# Patient Record
Sex: Male | Born: 1960 | Race: White | Hispanic: No | Marital: Married | Smoking: Former smoker
Health system: Southern US, Community
[De-identification: ages and names within clinical notes are randomized; demographics above are authoritative.]

## PROBLEM LIST (undated history)

## (undated) DIAGNOSIS — I2510 Atherosclerotic heart disease of native coronary artery without angina pectoris: Secondary | ICD-10-CM

## (undated) DIAGNOSIS — Z87442 Personal history of urinary calculi: Secondary | ICD-10-CM

## (undated) DIAGNOSIS — M109 Gout, unspecified: Secondary | ICD-10-CM

## (undated) DIAGNOSIS — E785 Hyperlipidemia, unspecified: Secondary | ICD-10-CM

## (undated) DIAGNOSIS — I221 Subsequent ST elevation (STEMI) myocardial infarction of inferior wall: Secondary | ICD-10-CM

## (undated) DIAGNOSIS — K219 Gastro-esophageal reflux disease without esophagitis: Secondary | ICD-10-CM

## (undated) DIAGNOSIS — I779 Disorder of arteries and arterioles, unspecified: Secondary | ICD-10-CM

## (undated) HISTORY — PX: KNEE SURGERY: SHX244

## (undated) HISTORY — DX: Hyperlipidemia, unspecified: E78.5

## (undated) HISTORY — DX: Atherosclerotic heart disease of native coronary artery without angina pectoris: I25.10

## (undated) HISTORY — DX: Subsequent ST elevation (STEMI) myocardial infarction of inferior wall: I22.1

## (undated) HISTORY — PX: ANKLE SURGERY: SHX546

---

## 2002-10-25 ENCOUNTER — Encounter: Payer: Self-pay | Admitting: Emergency Medicine

## 2002-10-25 ENCOUNTER — Emergency Department (HOSPITAL_COMMUNITY): Admission: EM | Admit: 2002-10-25 | Discharge: 2002-10-25 | Payer: Self-pay | Admitting: Emergency Medicine

## 2012-08-28 ENCOUNTER — Encounter (HOSPITAL_COMMUNITY): Payer: Self-pay | Admitting: *Deleted

## 2012-08-28 ENCOUNTER — Encounter (HOSPITAL_COMMUNITY)
Admission: EM | Disposition: A | Discharge status: Home / Self Care | Payer: Self-pay | Source: Home / Self Care | Attending: Cardiology

## 2012-08-28 ENCOUNTER — Inpatient Hospital Stay (HOSPITAL_COMMUNITY)
Admission: EM | Admit: 2012-08-28 | Discharge: 2012-08-29 | DRG: 853 | Disposition: A | Discharge status: Home / Self Care | Payer: BC Managed Care – PPO | Attending: Cardiology | Admitting: Cardiology

## 2012-08-28 ENCOUNTER — Ambulatory Visit (HOSPITAL_COMMUNITY): Admit: 2012-08-28 | Payer: Self-pay | Admitting: Cardiology

## 2012-08-28 DIAGNOSIS — I219 Acute myocardial infarction, unspecified: Secondary | ICD-10-CM

## 2012-08-28 DIAGNOSIS — I2119 ST elevation (STEMI) myocardial infarction involving other coronary artery of inferior wall: Secondary | ICD-10-CM

## 2012-08-28 DIAGNOSIS — I2510 Atherosclerotic heart disease of native coronary artery without angina pectoris: Secondary | ICD-10-CM

## 2012-08-28 DIAGNOSIS — Z955 Presence of coronary angioplasty implant and graft: Secondary | ICD-10-CM

## 2012-08-28 DIAGNOSIS — I2129 ST elevation (STEMI) myocardial infarction involving other sites: Principal | ICD-10-CM | POA: Diagnosis present

## 2012-08-28 DIAGNOSIS — E782 Mixed hyperlipidemia: Secondary | ICD-10-CM

## 2012-08-28 DIAGNOSIS — M109 Gout, unspecified: Secondary | ICD-10-CM | POA: Diagnosis present

## 2012-08-28 DIAGNOSIS — I213 ST elevation (STEMI) myocardial infarction of unspecified site: Secondary | ICD-10-CM

## 2012-08-28 DIAGNOSIS — Z8249 Family history of ischemic heart disease and other diseases of the circulatory system: Secondary | ICD-10-CM

## 2012-08-28 DIAGNOSIS — K219 Gastro-esophageal reflux disease without esophagitis: Secondary | ICD-10-CM | POA: Insufficient documentation

## 2012-08-28 DIAGNOSIS — R030 Elevated blood-pressure reading, without diagnosis of hypertension: Secondary | ICD-10-CM | POA: Diagnosis present

## 2012-08-28 DIAGNOSIS — E785 Hyperlipidemia, unspecified: Secondary | ICD-10-CM | POA: Diagnosis present

## 2012-08-28 DIAGNOSIS — R7309 Other abnormal glucose: Secondary | ICD-10-CM | POA: Diagnosis present

## 2012-08-28 HISTORY — PX: LEFT HEART CATHETERIZATION WITH CORONARY ANGIOGRAM: SHX5451

## 2012-08-28 HISTORY — DX: Gastro-esophageal reflux disease without esophagitis: K21.9

## 2012-08-28 HISTORY — DX: Gout, unspecified: M10.9

## 2012-08-28 LAB — BASIC METABOLIC PANEL
BUN: 14 mg/dL (ref 6–23)
GFR calc Af Amer: >90 mL/min (ref >90)
GFR calc non Af Amer: >90 mL/min (ref >90)
Potassium: 4.1 mEq/L (ref 3.5–5.1)
Sodium: 137 mEq/L (ref 135–145)

## 2012-08-28 LAB — HEMOGLOBIN A1C
Hgb A1c MFr Bld: 5.7 % — ABNORMAL HIGH (ref <5.7)
Mean Plasma Glucose: 117 mg/dL — ABNORMAL HIGH (ref <117)

## 2012-08-28 LAB — CBC
HCT: 41.3 % (ref 39.0–52.0)
Hemoglobin: 15 g/dL (ref 13.0–17.0)
MCHC: 35.1 g/dL (ref 30.0–36.0)
Platelets: 235 10*3/uL (ref 150–400)
RBC: 4.77 MIL/uL (ref 4.22–5.81)
RDW: 12.4 % (ref 11.5–15.5)
WBC: 9 10*3/uL (ref 4.0–10.5)
WBC: 7.9 10*3/uL (ref 4.0–10.5)

## 2012-08-28 LAB — COMPREHENSIVE METABOLIC PANEL
ALT: 31 U/L (ref 0–53)
AST: 20 U/L (ref 0–37)
Alkaline Phosphatase: 83 U/L (ref 39–117)
CO2: 25 mEq/L (ref 19–32)
Chloride: 101 mEq/L (ref 96–112)
GFR calc Af Amer: >90 mL/min (ref >90)
GFR calc non Af Amer: >90 mL/min (ref >90)
Glucose, Bld: 141 mg/dL — ABNORMAL HIGH (ref 70–99)
Potassium: 3.9 mEq/L (ref 3.5–5.1)
Sodium: 136 mEq/L (ref 135–145)

## 2012-08-28 LAB — POCT I-STAT, CHEM 8
BUN: 17 mg/dL (ref 6–23)
Creatinine, Ser: 1 mg/dL (ref 0.50–1.35)
Glucose, Bld: 146 mg/dL — ABNORMAL HIGH (ref 70–99)
Hemoglobin: 16 g/dL (ref 13.0–17.0)
Potassium: 4.1 mEq/L (ref 3.5–5.1)
TCO2: 23 mmol/L (ref 0–100)

## 2012-08-28 LAB — POCT ACTIVATED CLOTTING TIME: Activated Clotting Time: 410 seconds

## 2012-08-28 LAB — APTT: aPTT: 26 seconds (ref 24–37)

## 2012-08-28 LAB — TROPONIN I
Troponin I: 1.27 ng/mL (ref <0.30)
Troponin I: 2.09 ng/mL (ref <0.30)
Troponin I: 2.59 ng/mL (ref <0.30)

## 2012-08-28 LAB — MRSA PCR SCREENING: MRSA by PCR: NEGATIVE

## 2012-08-28 LAB — POCT I-STAT TROPONIN I: Troponin i, poc: 0.01 ng/mL (ref 0.00–0.08)

## 2012-08-28 SURGERY — LEFT HEART CATHETERIZATION WITH CORONARY ANGIOGRAM
Anesthesia: LOCAL

## 2012-08-28 MED ORDER — LIDOCAINE HCL (PF) 1 % IJ SOLN
INTRAMUSCULAR | Status: AC
  Filled 2012-08-28: qty 30

## 2012-08-28 MED ORDER — VERAPAMIL HCL 2.5 MG/ML IV SOLN
INTRAVENOUS | Status: AC
  Filled 2012-08-28: qty 2

## 2012-08-28 MED ORDER — HEPARIN SODIUM (PORCINE) 5000 UNIT/ML IJ SOLN
5000.0000 [IU] | Freq: Three times a day (TID) | INTRAMUSCULAR | Status: DC
  Administered 2012-08-28 – 2012-08-29 (×3): 5000 [IU] via SUBCUTANEOUS
  Filled 2012-08-28 (×7): qty 1

## 2012-08-28 MED ORDER — ACETAMINOPHEN 325 MG PO TABS: 650.0000 mg | ORAL_TABLET | ORAL | Status: DC | PRN

## 2012-08-28 MED ORDER — NITROGLYCERIN 1 MG/10 ML FOR IR/CATH LAB
INTRA_ARTERIAL | Status: AC
  Filled 2012-08-28: qty 10

## 2012-08-28 MED ORDER — ASPIRIN 81 MG PO CHEW
81.0000 mg | CHEWABLE_TABLET | Freq: Every day | ORAL | Status: DC
  Administered 2012-08-28 – 2012-08-29 (×2): 81 mg via ORAL
  Filled 2012-08-28 (×2): qty 1

## 2012-08-28 MED ORDER — HEPARIN SODIUM (PORCINE) 5000 UNIT/ML IJ SOLN
4000.0000 [IU] | INTRAMUSCULAR | Status: AC
  Administered 2012-08-28: 4000 [IU] via INTRAVENOUS
  Filled 2012-08-28: qty 0.8

## 2012-08-28 MED ORDER — OXYCODONE-ACETAMINOPHEN 5-325 MG PO TABS: 1.0000 | ORAL_TABLET | ORAL | Status: DC | PRN

## 2012-08-28 MED ORDER — ASPIRIN 81 MG PO CHEW
324.0000 mg | CHEWABLE_TABLET | Freq: Once | ORAL | Status: AC
  Administered 2012-08-28: 324 mg via ORAL

## 2012-08-28 MED ORDER — DIAZEPAM 5 MG PO TABS
5.0000 mg | ORAL_TABLET | ORAL | Status: DC | PRN
  Administered 2012-08-28: 5 mg via ORAL
  Filled 2012-08-28: qty 1

## 2012-08-28 MED ORDER — PRASUGREL HCL 10 MG PO TABS
ORAL_TABLET | ORAL | Status: AC
  Filled 2012-08-28: qty 6

## 2012-08-28 MED ORDER — ASPIRIN 81 MG PO CHEW: 324.0000 mg | CHEWABLE_TABLET | Freq: Once | ORAL | Status: DC

## 2012-08-28 MED ORDER — ASPIRIN 81 MG PO CHEW
CHEWABLE_TABLET | ORAL | Status: AC
  Filled 2012-08-28: qty 4

## 2012-08-28 MED ORDER — BIVALIRUDIN 250 MG IV SOLR
INTRAVENOUS | Status: AC
  Filled 2012-08-28: qty 250

## 2012-08-28 MED ORDER — MIDAZOLAM HCL 2 MG/2ML IJ SOLN
INTRAMUSCULAR | Status: AC
  Filled 2012-08-28: qty 2

## 2012-08-28 MED ORDER — HEPARIN (PORCINE) IN NACL 2-0.9 UNIT/ML-% IJ SOLN
INTRAMUSCULAR | Status: AC
  Filled 2012-08-28: qty 1000

## 2012-08-28 MED ORDER — FENTANYL CITRATE 0.05 MG/ML IJ SOLN
INTRAMUSCULAR | Status: AC
  Filled 2012-08-28: qty 2

## 2012-08-28 MED ORDER — PRASUGREL HCL 10 MG PO TABS
10.0000 mg | ORAL_TABLET | Freq: Every day | ORAL | Status: DC
  Administered 2012-08-29: 10 mg via ORAL
  Filled 2012-08-28: qty 1

## 2012-08-28 MED ORDER — SODIUM CHLORIDE 0.9 % IV SOLN
0.2500 mg/kg/h | INTRAVENOUS | Status: AC
  Administered 2012-08-28: 0.25 mg/kg/h via INTRAVENOUS

## 2012-08-28 MED ORDER — ATORVASTATIN CALCIUM 80 MG PO TABS
80.0000 mg | ORAL_TABLET | Freq: Every day | ORAL | Status: DC
  Administered 2012-08-28: 80 mg via ORAL
  Filled 2012-08-28 (×2): qty 1

## 2012-08-28 MED ORDER — SODIUM CHLORIDE 0.9 % IV SOLN: INTRAVENOUS | Status: DC

## 2012-08-28 MED ORDER — METOPROLOL TARTRATE 25 MG PO TABS
25.0000 mg | ORAL_TABLET | Freq: Two times a day (BID) | ORAL | Status: DC
  Administered 2012-08-28 – 2012-08-29 (×3): 25 mg via ORAL
  Filled 2012-08-28 (×4): qty 1

## 2012-08-28 MED ORDER — SODIUM CHLORIDE 0.9 % IV SOLN
0.2500 mg/kg/h | INTRAVENOUS | Status: DC
  Filled 2012-08-28: qty 250

## 2012-08-28 MED ORDER — ONDANSETRON HCL 4 MG/2ML IJ SOLN: 4.0000 mg | Freq: Four times a day (QID) | INTRAMUSCULAR | Status: DC | PRN

## 2012-08-28 MED ORDER — HEPARIN SODIUM (PORCINE) 1000 UNIT/ML IJ SOLN
INTRAMUSCULAR | Status: AC
  Filled 2012-08-28: qty 1

## 2012-08-28 MED ORDER — SODIUM CHLORIDE 0.9 % IV SOLN
1.0000 mL/kg/h | INTRAVENOUS | Status: AC
  Administered 2012-08-28: 1 mL/kg/h via INTRAVENOUS

## 2012-08-28 NOTE — Progress Notes (Signed)
CRITICAL VALUE ALERT  Critical value received:  Troponin I 1.27  Date of notification:  08/28/12  Time of notification:  0645  Critical value read back:yes  Nurse who received alert:  Park Pope, RN  MD notified (1st page):  [facility]  Time of first page:  [phone]  MD notified (2nd page):  Time of second page:  Responding MD:  [facility]   Time MD responded:  [phone]

## 2012-08-28 NOTE — ED Provider Notes (Addendum)
History     CSN: [id number]  Arrival date & time 08/28/12  0203   First MD Initiated Contact with Patient 08/28/12 0210      Chief Complaint  Patient presents with  . Chest Pain    (Consider location/radiation/quality/duration/timing/severity/associated sxs/prior treatment) HPI 52 yo male presents to the ER from home with complaint of chest pain.  Pain started Sunday, dull, and has been intermittent since.  Denies SOB, diaphoresis, nausea.  Pain worsened about a hour ago, waking from sleep.  No prior h/o ACS, no family history.  Pt reports cold sxs over the last week, thought chest pain was associated with that.  Had made appointment with pcm tomorrow for eval of chest pain.  Much worse tonight.  History reviewed. No pertinent past medical history. aside from gout  History reviewed. No pertinent past surgical history.  History reviewed. No pertinent family history.  History  Substance Use Topics  . Smoking status: Not on file  . Smokeless tobacco: Not on file  . Alcohol Use: No      Review of Systems  See History of Present Illness; otherwise all other systems are reviewed and negative  Allergies  Review of patient's allergies indicates not on file.  Home Medications  No current outpatient prescriptions on file. Fish oil, niacin  BP 149/92  Pulse 69  Temp(Src) 98.1 F (36.7 C) (Oral)  Resp 18  Ht 5\' 10"  (1.778 m)  Wt 240 lb (108.863 kg)  BMI 34.44 kg/m2  SpO2 100%  Physical Exam  Nursing note and vitals reviewed. Constitutional: He is oriented to person, place, and time. He appears well-developed and well-nourished. No distress.  HENT:  Head: Normocephalic and atraumatic.  Nose: Nose normal.  Mouth/Throat: Oropharynx is clear and moist.  Eyes: Conjunctivae and EOM are normal. Pupils are equal, round, and reactive to light.  Neck: Normal range of motion. Neck supple. No JVD present. No tracheal deviation present. No thyromegaly present.  Cardiovascular:  Normal rate, regular rhythm, normal heart sounds and intact distal pulses.  Exam reveals no gallop and no friction rub.   No murmur heard. Pulmonary/Chest: Effort normal and breath sounds normal. No stridor. No respiratory distress. He has no wheezes. He has no rales. He exhibits no tenderness.  Abdominal: Soft. Bowel sounds are normal. He exhibits no distension and no mass. There is no tenderness. There is no rebound and no guarding.  Musculoskeletal: Normal range of motion. He exhibits no edema and no tenderness.  Lymphadenopathy:    He has no cervical adenopathy.  Neurological: He is alert and oriented to person, place, and time. He exhibits normal muscle tone. Coordination normal.  Skin: Skin is warm and dry. No rash noted. He is not diaphoretic. No erythema. No pallor.  Psychiatric: He has a normal mood and affect. His behavior is normal. Judgment and thought content normal.    ED Course  Procedures (including critical care time)  CRITICAL CARE Performed by: Olivia Mackie   Total critical care time: 30 min  Critical care time was exclusive of separately billable procedures and treating other patients.  Critical care was necessary to treat or prevent imminent or life-threatening deterioration.  Critical care was time spent personally by me on the following activities: development of treatment plan with patient and/or surrogate as well as nursing, discussions with consultants, evaluation of patient's response to treatment, examination of patient, obtaining history from patient or surrogate, ordering and performing treatments and interventions, ordering and review of laboratory studies, ordering and  review of radiographic studies, pulse oximetry and re-evaluation of patient's condition.   Labs Reviewed  POCT I-STAT, CHEM 8 - Abnormal; Notable for the following:    Glucose, Bld 146 (*)    All other components within normal limits  APTT  CBC  COMPREHENSIVE METABOLIC PANEL   PROTIME-INR   No results found.   Date: 08/28/2012  Rate: 70  Rhythm: normal sinus rhythm  QRS Axis: normal  Intervals: normal  ST/T Wave abnormalities: ST elevations inferiorly, ST depressions anteriorly and acute myocardial infarction  Conduction Disutrbances:none  Narrative Interpretation:   Old EKG Reviewed: none available    1. STEMI (ST elevation myocardial infarction)       MDM  52 year old male with ST elevation in 23 aVF, reciprocal changes in aVL, V2.  Concern for inferior posterior wall infarction.  Patient has received aspirin and heparin bolus.  He is being transferred to [facility] for evaluation by Dr. [place] on call for cardiology, with possible PCI.  Patient and family updated on findings and plan.       Olivia Mackie, MD 08/28/12 4782  Olivia Mackie, MD 08/28/12 [phone]

## 2012-08-28 NOTE — Progress Notes (Addendum)
Patient Name: Roy Douglas Date of Encounter: 08/28/2012   Principal Problem:   ST elevation myocardial infarction (STEMI) of inferior wall, initial episode of care Active Problems:   CAD (coronary artery disease)   SUBJECTIVE:  No complaints at this time.  Chest pain free.  His Rt hand and wrist feel fine.  TR band still in place.  No SOB.  CURRENT MEDS . aspirin  81 mg Oral Daily  . atorvastatin  80 mg Oral q1800  . heparin  5,000 Units Subcutaneous Q8H  . metoprolol tartrate  25 mg Oral BID   OBJECTIVE  Filed Vitals:   08/28/12 0515 08/28/12 0530 08/28/12 0600 08/28/12 0700  BP: 125/107 124/85 121/81 117/87  Pulse:      Temp:      TempSrc:      Resp: 17 15 17 15   Height:      Weight:      SpO2: 99% 97% 97% 95%    Intake/Output Summary (Last 24 hours) at 08/28/12 0745 Last data filed at 08/28/12 0700  Gross per 24 hour  Intake  260.3 ml  Output    700 ml  Net -439.7 ml   Filed Weights   08/28/12 0216 08/28/12 0223 08/28/12 0430  Weight: 240 lb (108.863 kg) 240 lb (108.863 kg) 235 lb 10.8 oz (106.9 kg)    PHYSICAL EXAM  General: Pleasant, NAD. Neuro: Alert and oriented X 3. Moves all extremities spontaneously. Psych: Normal affect. HEENT:  Normal  Neck: Supple without bruits or JVD. Lungs:  Resp regular and unlabored, CTA. Heart: RRR no s3, s4, or murmurs. Abdomen: Soft, non-tender, non-distended, BS + x 4.  Extremities: No hematoma or bleeding in Rt cath site.  TR band in place.  Capillary refill < 3 seconds.  Rt hand warm. No bruti. No clubbing, cyanosis or edema. DP/PT/Radials 2+ and equal bilaterally.   Accessory Clinical Findings  CBC  Recent Labs  08/28/12 0220 08/28/12 0223 08/28/12 0550  WBC 9.0  --  7.9  HGB 15.0 16.0 14.5  HCT 43.5 47.0 41.3  MCV 91.2  --  88.4  PLT 245  --  235   Basic Metabolic Panel  Recent Labs  08/28/12 0220 08/28/12 0223 08/28/12 0550  NA 136 140 137  K 3.9 4.1 4.1  CL 101 108 106  CO2 25  --  21    GLUCOSE 141* 146* 119*  BUN 16 17 14   CREATININE 0.95 1.00 0.81  CALCIUM 8.9  --  9.0   Liver Function Tests  Recent Labs  08/28/12 0220  AST 20  ALT 31  ALKPHOS 83  BILITOT 0.2*  PROT 7.5  ALBUMIN 3.7   Cardiac Enzymes  Recent Labs  08/28/12 0550  TROPONINI 1.27*   TELE:  NSR, ST elevation resolved.  Normal ECG  ECG  Rsr, 71, no acute st/t changes.   Radiology/Studies  No results found.  ASSESSMENT AND PLAN:    1.  Acute Inf STEMI/CAD:  Pt presented with c/p and inf st elevation and subsequently underwent cath and PCI with DES to the RCA.  Troponin is mildly elevated @ 1.27.  He is currently pain free and f/u ecg is nl.  Cont asa, effient (not currently ordered), bb, statin.  Cardiac rehab to see.  2.  Lipids:  Currently unknown.  F/u lipids.  lft's ok.  Cont high potency statin.  3.  Hyperglycemia:  F/u A1c.  Signed, Henningsgaard, Elige Radon, Student-PA  Nicolasa Ducking NP Patient seen and  examined and history reviewed. Agree with above findings and plan. Doing well post PCI. Ecg is normal. Troponin mildly elevated. No recurrent chest pain. Will ambulate today. Check lipid status. Anticipate DC tomorrow if stable.  Theron Arista [facility] 08/28/2012 11:20 AM

## 2012-08-28 NOTE — ED Notes (Signed)
Pt states last week had a head cold, then Sunday and Monday had 2 episodes of chest pain, tonight awoke from sleep d/t generalized chest pain, denies numbness/tingling, denies SOB, states feels a little lightheaded.

## 2012-08-28 NOTE — Care Management Note (Signed)
    Page 1 of 1   08/28/2012     10:29:32 AM   CARE MANAGEMENT NOTE 08/28/2012  Patient:  Roy Douglas, Roy Douglas   Account Number:  [id number]  Date Initiated:  08/28/2012  Documentation initiated by:  Junius Creamer  Subjective/Objective Assessment:   adm w mi     Action/Plan:   lives w wife, pcp dr Molly Maduro freid   Anticipated DC Date:     Anticipated DC Plan:        DC Planning Services  CM consult      Choice offered to / List presented to:             Status of service:   Medicare Important Message given?   (If response is "NO", the following Medicare IM given date fields will be blank) Date Medicare IM given:   Date Additional Medicare IM given:    Discharge Disposition:    Per UR Regulation:  Reviewed for med. necessity/level of care/duration of stay  If discussed at Long Length of Stay Meetings, dates discussed:    Comments:  4/16 1028 debbie Sakara Lehtinen rn,bsn pt has 64.00 copay for effient. will give pt 30days free and copay assist card.

## 2012-08-28 NOTE — ED Notes (Signed)
Patient states that he had chest pain on Sunday. States that the pain went away and he thought the pain was due "to a cold." States that he had 2 more episodes of chest pain since then. Tonight was woken from sleep with chest pain.

## 2012-08-28 NOTE — Progress Notes (Signed)
  Echocardiogram 2D Echocardiogram has been performed.  Georgian Co 08/28/2012, 3:51 PM

## 2012-08-28 NOTE — Progress Notes (Signed)
CARDIAC REHAB PHASE I   PRE:  Rate/Rhythm: 75SR  BP:  Supine: 102/79  Sitting:   Standing:    SaO2:   MODE:  Ambulation: 350 ft   POST:  Rate/Rhythm: 92SR PVCs  BP:  Supine:   Sitting: 141/81  Standing:    SaO2:  1342-1430 Pt walked 350 ft with steady gait. Tolerated well. No CP. To recliner after walk. Education completed with pt and wife. Understanding voiced. Discussed CRP 2. Permission given to refer to [place] Phase 2.    Luetta Nutting, RN BSN  08/28/2012 2:26 PM

## 2012-08-28 NOTE — CV Procedure (Signed)
   Cardiac Catheterization Procedure Note  Name: Roy Douglas MRN: [id number] DOB: 1961-02-20  Procedure: Left Heart Cath, Selective Coronary Angiography, LV angiography, PTCA and stenting of the proximal to mid RCA  Indication: 52 year old white male presents with intermittent chest pain of several days' duration. On presentation ECG demonstrates 2 mm of ST elevation in the inferior leads with reciprocal changes in the anterior leads. Patient was transferred from [facility] for emergent cardiac catheterization. In transfer his chest pain resolved and his ST segment elevation resolved.  Procedural Details:  The right wrist was prepped, draped, and anesthetized with 1% lidocaine. Using the modified Seldinger technique, a 6 French sheath was introduced into the right radial artery. 3 mg of verapamil was administered through the sheath, weight-based unfractionated heparin was administered intravenously. Standard Judkins catheters were used for selective coronary angiography and left ventriculography. Catheter exchanges were performed over an exchange length guidewire.  PROCEDURAL FINDINGS Hemodynamics: AO 122/58 with a mean of 80 mmHg LV 128/13 mmHg   Coronary angiography: Coronary dominance: right  Left mainstem: There is moderate calcification of the left main coronary. There is no significant stenosis.  Left anterior descending (LAD): There is moderate calcification of the proximal LAD. This vessel extends to the apex. It gives rise to 2 diagonal branches. In the mid LAD immediately after the second diagonal there is a 30-40% focal stenosis.  Left circumflex (LCx): The left circumflex gives rise to a large marginal branch that then bifurcates in the mid vessel. It is without significant disease.  Right coronary artery (RCA): The right coronary is a dominant vessel. There is a 95% stenosis in the proximal vessel. There is a long segment of disease extending through the mid vessel  up to 50-70%.  Left ventriculography: Left ventricular systolic function is abnormal. LV ejection fraction is estimated at 45-50%. There is inferior wall hypokinesis. There is no significant mitral insufficiency.  PCI Note:  Following the diagnostic procedure, the decision was made to proceed with PCI. Effient 60 mg was given orally. Weight-based bivalirudin was given for anticoagulation. Once a therapeutic ACT was achieved, a 6 [place] FR4 guide catheter was inserted.  A pro-water coronary guidewire was used to cross the lesion.  The lesion was predilated with a 2.5 mm balloon.  The lesion was then stented with a 3.0 x 38 mm Promus premier stent in the mid RCA.  We then placed a second 3.0 x 12 mm Promus premier stent in the proximal RCA overlapping with the initial stent. The stent was postdilated with a 3.25 mm noncompliant balloon.  Following PCI, there was 0% residual stenosis and TIMI-3 flow. Final angiography confirmed an excellent result. The patient tolerated the procedure well. There were no immediate procedural complications. A TR band was used for radial hemostasis. The patient was transferred to the post catheterization recovery area for further monitoring.  PCI Data: Vessel - RCA/Segment - proximal to mid Percent Stenosis (pre)  95% TIMI-flow 3 Stent 3.0 x 38 and 3.0 x 12 mm Promus premier stents Percent Stenosis (post) 0% TIMI-flow (post) 3  Final Conclusions:   1. Single vessel obstructive coronary artery disease. 2. Mild left ventricular dysfunction. 3. Successful stenting of the proximal to mid RCA with drug-eluting stents.   Recommendations:  Risk factor modification. Continue dual antiplatelet therapy for one year. Patient is a candidate for fast-track discharge.  [facility] 08/28/2012, 4:02 AM

## 2012-08-28 NOTE — H&P (Signed)
Physician History and Physical    Roy Douglas MRN: [id number] DOB/AGE: 51-24-1962 51 y.o. Admit date: 08/28/2012  Primary Care Physician: Dr. Foy Guadalajara Primary Cardiologist: New  HPI:  52 yo with minimal past history presented to [facility] with chest pain.  Patient first developed central chest pain on Sunday morning.  He thought that it was related to a cold that he has had for the last week.  Chest pain was mild and would come and go every couple of minutes.  He was able to work outside in the yard for most of the day without problems.  The pain returned for a period Monday morning and again this morning.  It lasted most of the day today on and off and was mild.  Then, the pain woke him up at 1:30 this morning.  It was much more severe than in the past.  He came to the ER where ECG showed inferior ST elevation concerning for STEMI.  He was given aspirin and transferred to [facility].   On the way here, his chest pain resolved.  He is now CP-free with ECG now showing inferior and anterolateral ST depression.    Patient does not smoke.  He says that his BP is sometimes elevated but he has not been given a formal diagnosis of HTN.  No diabetes.  His mother has had 2 stents but he is not sure how old she was when she got them.   Review of systems complete and found to be negative unless listed above   PMH: 1. Gout 2. Chest pain: Negative stress test in 6/13.  3. Elevated BP: Not on medications.   FH: Mother with CAD s/p PCI, not sure what age she was  History reviewed. No pertinent family history.  History   Social History  . Marital Status: Married    Spouse Name: N/A    Number of Children: N/A  . Years of Education: N/A   Occupational History  . Not on file.   Social History Main Topics  . Smoking status: Nonsmoker  . Smokeless tobacco: Not on file  . Alcohol Use: No  . Drug Use: No  . Sexually Active: Not on file   Other Topics Concern  . Not on file   Social History  Narrative  . Lives in [place]      (Not in a hospital admission)  Physical Exam: Blood pressure 148/92, pulse 69, temperature 98.4 F (36.9 C), temperature source Oral, resp. rate 16, height 5\' 10"  (1.778 m), weight 240 lb (108.863 kg), SpO2 100.00%.  General: NAD Neck: No JVD, no thyromegaly or thyroid nodule.  Lungs: Clear to auscultation bilaterally with normal respiratory effort. CV: Nondisplaced PMI.  Heart regular S1/S2, no S3/S4, no murmur.  No peripheral edema.  No carotid bruit.  Normal pedal pulses.  Abdomen: Soft, nontender, no hepatosplenomegaly, no distention.  Skin: Intact without lesions or rashes.  Neurologic: Alert and oriented x 3.  Psych: Normal affect. Extremities: No clubbing or cyanosis.  HEENT: Normal.   Labs:   Lab Results  Component Value Date   WBC 9.0 08/28/2012   HGB 16.0 08/28/2012   HCT 47.0 08/28/2012   MCV 91.2 08/28/2012   PLT 245 08/28/2012    Recent Labs Lab 08/28/12 0220 08/28/12 0223  NA 136 140  K 3.9 4.1  CL 101 108  CO2 25  --   BUN 16 17  CREATININE 0.95 1.00  CALCIUM 8.9  --   PROT  7.5  --   BILITOT 0.2*  --   ALKPHOS 83  --   ALT 31  --   AST 20  --   GLUCOSE 141* 146*    EKG: Initial ECG with NSR, inferior ST elevation.  Followup ECG with inferior and anterolateral ST depression.   ASSESSMENT AND PLAN:  52 yo with minimal PMH presents with inferior STEMI with spontaneous resolution of chest pain and ST elevation.  I am still concerned for unstable plaque.  He will go to the cath lab for emergent cardiac catheterization this morning.  Further recommendations to be based on cath findings.   Marca Ancona 08/28/2012, 3:03 AM

## 2012-08-28 NOTE — ED Provider Notes (Signed)
2:49 AM Resolution of all pain.  The patient is completely pain-free at this time.  His ST elevation is resolved.  This may represent spasm versus intermittent plaque occlusion.  The patient was met at the bedside by cardiology and myself.  The patient will be taken to catheter lab. ASA and heparin already given    Date: 08/28/2012  Rate: 75  Rhythm: normal sinus rhythm  QRS Axis: normal  Intervals: normal  ST/T Wave abnormalities: lateral ST depression and nonspecific inferior ST changes  Conduction Disutrbances: none  Narrative Interpretation:   Old EKG Reviewed: ST elevation in inferior leads no longer present.     Acute coronary syndrome       Lyanne Co, MD 08/28/12 [phone]

## 2012-08-28 NOTE — ED Notes (Signed)
Patient transported from [facility] for CP. Pt received 325 asa and 4000 units of Heparin at that facility. Pt currently pain free. Alertx4, NAD

## 2012-08-29 ENCOUNTER — Encounter (HOSPITAL_COMMUNITY): Payer: Self-pay | Admitting: *Deleted

## 2012-08-29 DIAGNOSIS — I2510 Atherosclerotic heart disease of native coronary artery without angina pectoris: Secondary | ICD-10-CM

## 2012-08-29 DIAGNOSIS — R7309 Other abnormal glucose: Secondary | ICD-10-CM | POA: Diagnosis present

## 2012-08-29 DIAGNOSIS — E782 Mixed hyperlipidemia: Secondary | ICD-10-CM | POA: Diagnosis present

## 2012-08-29 LAB — CBC
MCH: 31.2 pg (ref 26.0–34.0)
MCHC: 34.8 g/dL (ref 30.0–36.0)
MCV: 89.6 fL (ref 78.0–100.0)
Platelets: 219 10*3/uL (ref 150–400)
RDW: 12.5 % (ref 11.5–15.5)

## 2012-08-29 LAB — BASIC METABOLIC PANEL
CO2: 23 mEq/L (ref 19–32)
Calcium: 8.8 mg/dL (ref 8.4–10.5)
Creatinine, Ser: 0.92 mg/dL (ref 0.50–1.35)
GFR calc Af Amer: >90 mL/min (ref >90)
GFR calc non Af Amer: >90 mL/min (ref >90)

## 2012-08-29 LAB — LIPID PANEL: Total CHOL/HDL Ratio: 8.6 RATIO

## 2012-08-29 MED ORDER — PRASUGREL HCL 10 MG PO TABS: 10.0000 mg | ORAL_TABLET | Freq: Every day | ORAL | Status: DC

## 2012-08-29 MED ORDER — ATORVASTATIN CALCIUM 80 MG PO TABS: 80.0000 mg | ORAL_TABLET | Freq: Every day | ORAL | Status: DC

## 2012-08-29 MED ORDER — HEPARIN SODIUM (PORCINE) 1000 UNIT/ML IJ SOLN
INTRAMUSCULAR | Status: AC
  Filled 2012-08-29: qty 1

## 2012-08-29 MED ORDER — ASPIRIN 81 MG PO TABS: 81.0000 mg | ORAL_TABLET | Freq: Every day | ORAL | Status: DC

## 2012-08-29 MED ORDER — NITROGLYCERIN 0.4 MG SL SUBL: 0.4000 mg | SUBLINGUAL_TABLET | SUBLINGUAL | Status: DC | PRN

## 2012-08-29 MED ORDER — METOPROLOL TARTRATE 25 MG PO TABS: 25.0000 mg | ORAL_TABLET | Freq: Two times a day (BID) | ORAL | Status: DC

## 2012-08-29 MED FILL — Sodium Chloride IV Soln 0.9%: INTRAVENOUS | Qty: 50 | Status: AC

## 2012-08-29 NOTE — Progress Notes (Signed)
Patient Name: Roy Douglas Date of Encounter: 08/29/2012   Principal Problem:   ST elevation myocardial infarction (STEMI) of inferior wall, initial episode of care Active Problems:   CAD (coronary artery disease)   SUBJECTIVE:  No  Chest pain.    No SOB. Some discomfort left shoulder from sleeping on it.  CURRENT MEDS . aspirin  81 mg Oral Daily  . atorvastatin  80 mg Oral q1800  . heparin      . heparin  5,000 Units Subcutaneous Q8H  . metoprolol tartrate  25 mg Oral BID  . prasugrel  10 mg Oral Daily   OBJECTIVE  Filed Vitals:   08/28/12 1651 08/28/12 2040 08/29/12 0025 08/29/12 0400  BP: 143/81 110/63 118/71 120/65  Pulse: 77  77 67  Temp: 98.2 F (36.8 C) 98.8 F (37.1 C) 97.5 F (36.4 C) 97.8 F (36.6 C)  TempSrc: Oral Oral Oral Oral  Resp: 15 18 18 16   Height:      Weight:      SpO2: 97% 97% 98% 97%    Intake/Output Summary (Last 24 hours) at 08/29/12 0716 Last data filed at 08/28/12 1200  Gross per 24 hour  Intake  635.6 ml  Output    700 ml  Net  -64.4 ml   Filed Weights   08/28/12 0216 08/28/12 0223 08/28/12 0430  Weight: 240 lb (108.863 kg) 240 lb (108.863 kg) 235 lb 10.8 oz (106.9 kg)    PHYSICAL EXAM  General: Pleasant, NAD. Neuro: Alert and oriented X 3. Moves all extremities spontaneously. Psych: Normal affect. HEENT:  Normal  Neck: Supple without bruits or JVD. Lungs:  Resp regular and unlabored, CTA. Heart: RRR no s3, s4, or murmurs. Abdomen: Soft, non-tender, non-distended, BS + x 4.  Extremities: No hematoma or bleeding in Rt cath site.   No clubbing, cyanosis or edema. DP/PT/Radials 2+ and equal bilaterally.   Accessory Clinical Findings  CBC  Recent Labs  08/28/12 0550 08/29/12 0418  WBC 7.9 7.8  HGB 14.5 13.8  HCT 41.3 39.6  MCV 88.4 89.6  PLT 235 219   Basic Metabolic Panel  Recent Labs  08/28/12 0550 08/29/12 0418  NA 137 137  K 4.1 4.1  CL 106 105  CO2 21 23  GLUCOSE 119* 119*  BUN 14 11  CREATININE  0.81 0.92  CALCIUM 9.0 8.8   Liver Function Tests  Recent Labs  08/28/12 0220  AST 20  ALT 31  ALKPHOS 83  BILITOT 0.2*  PROT 7.5  ALBUMIN 3.7   Cardiac Enzymes  Recent Labs  08/28/12 0550 08/28/12 0910 08/28/12 1618  TROPONINI 1.27* 2.09* 2.59*   Lab Results  Component Value Date   CHOL 216* 08/29/2012   HDL 25* 08/29/2012   LDLCALC UNABLE TO CALCULATE IF TRIGLYCERIDE OVER 400 mg/dL 11/02/3084   TRIG 578* 4/69/6295   CHOLHDL 8.6 08/29/2012   Lab Results  Component Value Date   HGBA1C 5.7* 08/28/2012   TELE:  NSR, occ PVC  ECG  Rsr, 71, no acute st/t changes.   Radiology/Studies  No results found.  ASSESSMENT AND PLAN:    1.  Acute Inf STEMI/CAD:  Pt presented with c/p and inf st elevation and subsequently underwent cath and PCI with DES to the RCA.  Troponin is peak 2.59.  He is currently pain free and f/u ecg is nl. Echo is normal. Cont asa, effient, bb, statin.  Cardiac rehab. DC home today. May resume driving 2/84/13. Return to work 09/09/12.  2. Hyperlipidemia-combined:    lft's ok.  Cont high potency statin. Heart healthy diet with weight loss.  3.  Hyperglycemia: A1c normal. Should improve with appropriate diet and weight control.   [place]ajo Royal [facility] 08/29/2012 7:16 AM

## 2012-08-29 NOTE — Discharge Summary (Signed)
CARDIOLOGY DISCHARGE SUMMARY   Patient ID: Roy Douglas MRN: [id number] DOB/AGE: [birth year] 51 y.o.  Admit date: 08/28/2012 Discharge date: 08/29/2012  Primary Discharge Diagnosis:    ST elevation myocardial infarction (STEMI) of inferior wall, initial episode of care - s/p PCI with DES to the RCA   Secondary Discharge Diagnosis:    CAD (coronary artery disease)   Elevated glucose   Combined hyperlipidemia  Procedures:  Left Heart Cath, Selective Coronary Angiography, LV angiography, PTCA and stenting of the proximal to mid RCA,  2-D echocardiogram    Hospital Course: Roy Douglas is a 52 y.o. male with no history of CAD. He had recurrent chest pain prior to admission. On the day of admission he woke with chest pain that was more severe and his ECG showed inferior ST elevation. He went to the [facility] room where he was treated with aspirin and transferred to [facility]. His chest pain resolved but his ECG was still significantly abnormal so he was taken to the Cath Lab.  The full cardiac catheterization results are below but he had a drug-eluting stent the RCA with medical therapy for other nonobstructive disease. He was admitted to intensive care.  He was started on high-dose statin and a lipid profile is listed below. He was noted to be hyperglycemic but hemoglobin A1c was within normal limits. Dietary changes are encouraged and he should followup with primary care for this. He had a beta blocker added to his medication regimen and as well and tolerated this. He was seen by cardiac rehabilitation and educated on heart-healthy lifestyle modifications and exercise guidelines. He will follow up with outpatient cardiac rehabilitation.  A 08/29/2012, Roy Douglas was doing well and ambulating without chest pain or shortness of breath. He was evaluated by Dr. Swaziland and considered stable for discharge, to follow up as an outpatient.  Labs:   Lab Results  Component Value Date     WBC 7.8 08/29/2012   HGB 13.8 08/29/2012   HCT 39.6 08/29/2012   MCV 89.6 08/29/2012   PLT 219 08/29/2012    Recent Labs Lab 08/28/12 0220  08/29/12 0418  NA 136  < > 137  K 3.9  < > 4.1  CL 101  < > 105  CO2 25  < > 23  BUN 16  < > 11  CREATININE 0.95  < > 0.92  CALCIUM 8.9  < > 8.8  PROT 7.5  --   --   BILITOT 0.2*  --   --   ALKPHOS 83  --   --   ALT 31  --   --   AST 20  --   --   GLUCOSE 141*  < > 119*  < > = values in this interval not displayed.  Recent Labs  08/28/12 0550 08/28/12 0910 08/28/12 1618  TROPONINI 1.27* 2.09* 2.59*   Lipid Panel     Component Value Date/Time   CHOL 216* 08/29/2012 0418   TRIG 524* 08/29/2012 0418   HDL 25* 08/29/2012 0418   CHOLHDL 8.6 08/29/2012 0418   VLDL UNABLE TO CALCULATE IF TRIGLYCERIDE OVER 400 mg/dL 11/11/5282 1324   LDLCALC UNABLE TO CALCULATE IF TRIGLYCERIDE OVER 400 mg/dL 08/14/270 5366    Recent Labs  08/28/12 0220  INR 0.90   Lab Results  Component Value Date   HGBA1C 5.7* 08/28/2012    Cardiac Cath: 08/28/2012 Left mainstem: There is moderate calcification of the left main coronary. There is no significant stenosis.  Left anterior descending (LAD): There is moderate calcification of the proximal LAD. This vessel extends to the apex. It gives rise to 2 diagonal branches. In the mid LAD immediately after the second diagonal there is a 30-40% focal stenosis.  Left circumflex (LCx): The left circumflex gives rise to a large marginal branch that then bifurcates in the mid vessel. It is without significant disease.  Right coronary artery (RCA): The right coronary is a dominant vessel. There is a 95% stenosis in the proximal vessel. There is a long segment of disease extending through the mid vessel up to 50-70%.  Left ventriculography: Left ventricular systolic function is abnormal. LV ejection fraction is estimated at 45-50%. There is inferior wall hypokinesis. There is no significant mitral insufficiency. PCI Data:   Vessel - RCA/Segment - proximal to mid  Percent Stenosis (pre) 95%  TIMI-flow 3  Stent 3.0 x 38 and 3.0 x 12 mm Promus premier stents  Percent Stenosis (post) 0%  TIMI-flow (post) 3  Final Conclusions:  1. Single vessel obstructive coronary artery disease.  2. Mild left ventricular dysfunction.  3. Successful stenting of the proximal to mid RCA with drug-eluting stents.   EKG: 28-Aug-2012 07:23:10 [facility] System-MC-CCU ROUTINE RECORD Normal sinus rhythm Normal ECG 59mm/s 67mm/mV 100Hz  8.0.1 12SL 241 HD CID: 0 Referred by: Confirmed By: Marca Ancona MD Vent. rate 71 BPM PR interval 160 ms QRS duration 80 ms QT/QTc 398/432 ms P-R-T axes 10 15 44  Echo: 08/28/2012 Conclusions Left ventricle: The cavity size was normal. Wall thickness was normal. Systolic function was normal. The estimated ejection fraction was in the range of 50% to 55%. Wall motion was normal; there were no regional wall motion abnormalities.    FOLLOW UP PLANS AND APPOINTMENTS Allergies  Allergen Reactions  . Keflex (Cephalexin)     Mental changes     Medication List    TAKE these medications       aspirin 81 MG tablet  Take 1 tablet (81 mg total) by mouth daily.     atorvastatin 80 MG tablet  Commonly known as:  LIPITOR  Take 1 tablet (80 mg total) by mouth daily.     FISH OIL CONCENTRATE PO  Take 1 capsule by mouth daily.     LORazepam 0.5 MG tablet  Commonly known as:  ATIVAN  Take 0.5 mg by mouth daily as needed for anxiety.     meloxicam 15 MG tablet  Commonly known as:  MOBIC  Take 15 mg by mouth daily as needed for pain.     metoprolol tartrate 25 MG tablet  Commonly known as:  LOPRESSOR  Take 1 tablet (25 mg total) by mouth 2 (two) times daily.     multivitamin with minerals tablet  Take 1 tablet by mouth daily.     niacin 500 MG CR tablet  Commonly known as:  NIASPAN  Take 500 mg by mouth daily before lunch.     nitroGLYCERIN 0.4 MG SL tablet  Commonly known  as:  NITROSTAT  Place 1 tablet (0.4 mg total) under the tongue every 5 (five) minutes as needed for chest pain.     NYQUIL PO  Take 1 capsule by mouth at bedtime as needed (cold symptoms).     prasugrel 10 MG Tabs  Commonly known as:  EFFIENT  Take 1 tablet (10 mg total) by mouth daily.        Discharge Orders   Future Appointments Provider Department Dept Phone  09/12/2012 11:30 AM Peter M Swaziland, MD [facility] Main Office [place]) [phone]   Future Orders Complete By Expires     Amb Referral to Cardiac Rehabilitation  As directed     Diet - low sodium heart healthy  As directed     Increase activity slowly  As directed       Follow-up Information   Follow up with Peter Swaziland, MD On 09/12/2012. (at 11:30 am)    Contact information:   [address] [place] [address] [phone]       Follow up with FRIED, Doris Cheadle, MD. (As needed)    Contact information:   [address] [place] [address] [phone]       BRING ALL MEDICATIONS WITH YOU TO FOLLOW UP APPOINTMENTS  Time spent with patient to include physician time: 36 min Signed: Theodore Demark, PA-C 08/29/2012, 10:18 AM Co-Sign MD

## 2012-08-29 NOTE — Progress Notes (Signed)
Monitor and IV's removed.  D/C instructions given to patient and wife.  Lengthy conversation about lifestyle modifications.  Teach back used.   Pt d/c with all belongings to the car with wife.  Suzzette Righter [facility]

## 2012-08-29 NOTE — Progress Notes (Signed)
CARDIAC REHAB PHASE I   PRE:  Rate/Rhythm: 82SR  BP:  Supine:   Sitting: 113/67  Standing:    SaO2:   MODE:  Ambulation: 700 ft   POST:  Rate/Rhythm: 92SR  BP:  Supine:   Sitting: 103/65  Standing:    SaO2:  0840-0857  Pt walked 700 ft with steady gait. Tolerated well. No questions re ed yesterday.   Luetta Nutting, RN BSN  08/29/2012 8:54 AM

## 2012-08-29 NOTE — Discharge Summary (Signed)
Patient seen and examined and history reviewed. Agree with above findings and plan. See my earlier rounding note.   Theron Arista JordanMD 08/29/2012 1:24 PM

## 2012-09-12 ENCOUNTER — Ambulatory Visit (INDEPENDENT_AMBULATORY_CARE_PROVIDER_SITE_OTHER): Payer: BC Managed Care – PPO | Admitting: Cardiology

## 2012-09-12 ENCOUNTER — Encounter: Payer: Self-pay | Admitting: Cardiology

## 2012-09-12 VITALS — BP 118/78 | HR 75 | Ht 70.0 in | Wt 234.8 lb

## 2012-09-12 DIAGNOSIS — R7309 Other abnormal glucose: Secondary | ICD-10-CM

## 2012-09-12 DIAGNOSIS — I2119 ST elevation (STEMI) myocardial infarction involving other coronary artery of inferior wall: Secondary | ICD-10-CM

## 2012-09-12 DIAGNOSIS — E782 Mixed hyperlipidemia: Secondary | ICD-10-CM

## 2012-09-12 DIAGNOSIS — I2510 Atherosclerotic heart disease of native coronary artery without angina pectoris: Secondary | ICD-10-CM

## 2012-09-12 NOTE — Progress Notes (Signed)
Freda Munro Date of Birth: 1960/10/03 Medical Record #[id number]  History of Present Illness: Mr. Carfagno is seen for followup after recent hospitalization with an acute inferior ST elevation MI. He underwent emergent stenting of the right coronary with drug-eluting stents. The segment of disease in the right coronary was fairly long and it required 2 stents to adequately cover. He otherwise had nonobstructive disease. Left ventricular function was mildly reduced by cath at 45-50%. By subsequent echocardiogram it was 50-55%. He continues to do very well since discharge. He has had no recurrent chest pain or shortness of breath. He is watching his diet carefully. The only thing he noted was that he was quite irritable for the first week or so. He is planning to enroll in cardiac rehabilitation.  Current Outpatient Prescriptions on File Prior to Visit  Medication Sig Dispense Refill  . aspirin 81 MG tablet Take 1 tablet (81 mg total) by mouth daily.  30 tablet    . atorvastatin (LIPITOR) 80 MG tablet Take 1 tablet (80 mg total) by mouth daily.  30 tablet  11  . LORazepam (ATIVAN) 0.5 MG tablet Take 0.5 mg by mouth daily as needed for anxiety.      . meloxicam (MOBIC) 15 MG tablet Take 15 mg by mouth daily as needed for pain.      . metoprolol tartrate (LOPRESSOR) 25 MG tablet Take 1 tablet (25 mg total) by mouth 2 (two) times daily.  60 tablet  11  . Multiple Vitamins-Minerals (MULTIVITAMIN WITH MINERALS) tablet Take 1 tablet by mouth daily.      . niacin (NIASPAN) 500 MG CR tablet Take 500 mg by mouth daily before lunch.      . nitroGLYCERIN (NITROSTAT) 0.4 MG SL tablet Place 1 tablet (0.4 mg total) under the tongue every 5 (five) minutes as needed for chest pain.  25 tablet  3  . Omega-3 Fatty Acids (FISH OIL CONCENTRATE PO) Take 1 capsule by mouth daily.      . prasugrel (EFFIENT) 10 MG TABS Take 1 tablet (10 mg total) by mouth daily.  30 tablet  11   No current facility-administered  medications on file prior to visit.    Allergies  Allergen Reactions  . Keflex (Cephalexin)     Mental changes    Past Medical History  Diagnosis Date  . GERD (gastroesophageal reflux disease)   . Gout   . CAD (coronary artery disease)   . Subsequent st elevation (stemi) myocardial infarction of inferior wall     stent RCA DES 08/28/12  . Hyperlipidemia     Past Surgical History  Procedure Laterality Date  . Knee surgery    . Ankle surgery      History  Smoking status  . Former Smoker  . Types: Cigarettes  . Quit date: 07/30/2011  Smokeless tobacco  . Not on file    History  Alcohol Use No    History reviewed. No pertinent family history.  Review of Systems: As noted in history of present illness.  All other systems were reviewed and are negative.  Physical Exam: BP 118/78  Pulse 75  Ht 5\' 10"  (1.778 m)  Wt 234 lb 12.8 oz (106.505 kg)  BMI 33.69 kg/m2  SpO2 96% He is a young, overweight white male in no acute distress. HEENT: Normal Neck: No JVD or bruits. No adenopathy or thyromegaly. Lungs: Clear Cardiovascular: Regular rate and rhythm, normal S1 and S2, no gallop, murmur, or click. Abdomen: Soft and nontender.  No masses or bruits. Bowel sounds positive. Extremities: Radial and pedal pulses are 2+. No edema. Neuro: Alert and oriented x3. Cranial nerves II through XII are intact. Skin: Warm and dry. LABORATORY DATA:   Assessment / Plan: 1. Coronary disease with recent inferior STEMI status post extensive stenting of the right coronary with drug-eluting stents. Continue dual antiplatelet therapy for one year. Continue beta blocker therapy and statin therapy.  2. Dyslipidemia. Triglycerides were significantly elevated during his hospital stay. He is on Lipitor and niacin. Continue dietary modifications. He is scheduled for complete physical with his primary care in June.

## 2012-09-12 NOTE — Patient Instructions (Addendum)
Continue your current therapy  Keep working on your lifestyle changes with diet and exercise.  I will see you in 2 months with fasting lab work

## 2012-12-03 ENCOUNTER — Ambulatory Visit (INDEPENDENT_AMBULATORY_CARE_PROVIDER_SITE_OTHER): Payer: BC Managed Care – PPO | Admitting: Cardiology

## 2012-12-03 ENCOUNTER — Encounter: Payer: Self-pay | Admitting: Cardiology

## 2012-12-03 VITALS — BP 124/78 | HR 84 | Ht 70.0 in | Wt 227.4 lb

## 2012-12-03 DIAGNOSIS — I2119 ST elevation (STEMI) myocardial infarction involving other coronary artery of inferior wall: Secondary | ICD-10-CM

## 2012-12-03 DIAGNOSIS — I2510 Atherosclerotic heart disease of native coronary artery without angina pectoris: Secondary | ICD-10-CM

## 2012-12-03 DIAGNOSIS — E782 Mixed hyperlipidemia: Secondary | ICD-10-CM

## 2012-12-03 NOTE — Progress Notes (Signed)
Freda Munro Date of Birth: 11/21/1960 Medical Record #[id number]  History of Present Illness: Mr. Stann is seen for followup after  an acute inferior ST elevation MI on 08/28/2012. He underwent emergent stenting of the right coronary with drug-eluting stents. The segment of disease in the right coronary was fairly long and it required 2 stents to adequately cover. He otherwise had nonobstructive disease. Left ventricular function was mildly reduced by cath at 45-50%. By subsequent echocardiogram it was 50-55%. He continues to do very well since discharge. He has had no recurrent chest pain or shortness of breath. He has made significant lifestyle modifications with his diet exercise program. He has lost an additional 7 pounds. He states that initially he had significant joint pain but since then his arthralgias have resolved. He is walking 30-45 minutes a day.  Current Outpatient Prescriptions on File Prior to Visit  Medication Sig Dispense Refill  . aspirin 81 MG tablet Take 1 tablet (81 mg total) by mouth daily.  30 tablet    . atorvastatin (LIPITOR) 80 MG tablet Take 1 tablet (80 mg total) by mouth daily.  30 tablet  11  . LORazepam (ATIVAN) 0.5 MG tablet Take 0.5 mg by mouth daily as needed for anxiety.      . metoprolol tartrate (LOPRESSOR) 25 MG tablet Take 1 tablet (25 mg total) by mouth 2 (two) times daily.  60 tablet  11  . Multiple Vitamins-Minerals (MULTIVITAMIN WITH MINERALS) tablet Take 1 tablet by mouth daily.      . niacin (NIASPAN) 500 MG CR tablet Take 500 mg by mouth daily before lunch.      . nitroGLYCERIN (NITROSTAT) 0.4 MG SL tablet Place 1 tablet (0.4 mg total) under the tongue every 5 (five) minutes as needed for chest pain.  25 tablet  3  . Omega-3 Fatty Acids (FISH OIL CONCENTRATE PO) Take 1 capsule by mouth daily.      . prasugrel (EFFIENT) 10 MG TABS Take 1 tablet (10 mg total) by mouth daily.  30 tablet  11  . meloxicam (MOBIC) 15 MG tablet Take 15 mg by mouth  daily as needed for pain.       No current facility-administered medications on file prior to visit.    Allergies  Allergen Reactions  . Keflex (Cephalexin)     Mental changes    Past Medical History  Diagnosis Date  . GERD (gastroesophageal reflux disease)   . Gout   . CAD (coronary artery disease)   . Subsequent st elevation (stemi) myocardial infarction of inferior wall     stent RCA DES 08/28/12  . Hyperlipidemia     Past Surgical History  Procedure Laterality Date  . Knee surgery    . Ankle surgery      History  Smoking status  . Former Smoker  . Types: Cigarettes  . Quit date: 07/30/2011  Smokeless tobacco  . Not on file    History  Alcohol Use No    History reviewed. No pertinent family history.  Review of Systems: As noted in history of present illness.  All other systems were reviewed and are negative.  Physical Exam: BP 124/78  Pulse 84  Ht 5\' 10"  (1.778 m)  Wt 227 lb 6.4 oz (103.148 kg)  BMI 32.63 kg/m2  SpO2 98% He is a young,  white male in no acute distress. HEENT: Normal Neck: No JVD or bruits. No adenopathy or thyromegaly. Lungs: Clear Cardiovascular: Regular rate and rhythm, normal S1 and  S2, no gallop, murmur, or click. Abdomen: Soft and nontender. No masses or bruits. Bowel sounds positive. Extremities: Radial and pedal pulses are 2+. No edema. Neuro: Alert and oriented x3. Cranial nerves II through XII are intact. Skin: Warm and dry. LABORATORY DATA: Date 11/01/2012 complete chemistry panel and CBC were normal. Total cholesterol 162, triglycerides 241, HDL 33, LDL 90.  Assessment / Plan: 1. Coronary disease status post recent inferior STEMI status post extensive stenting of the right coronary with drug-eluting stents. Continue dual antiplatelet therapy for one year. Continue beta blocker therapy and statin therapy.  2. Dyslipidemia. Triglycerides were significantly elevated during his hospital stay. He is on Lipitor and niacin.  Continue dietary modifications. Given recent data from the [place] Journal of Medicine showing no evidence of outcome improvement with niacin and potential harm I have recommended that he stop taking niacin at this point. We will continue high-dose Lipitor. I recommended he increase his fish oil to 2 g daily.

## 2012-12-03 NOTE — Patient Instructions (Signed)
Stop niacin.  Increase fish oil to 2 tablets daily.  I will see you in 6 months.

## 2013-02-04 ENCOUNTER — Telehealth (HOSPITAL_COMMUNITY): Payer: Self-pay | Admitting: Cardiac Rehabilitation

## 2013-02-04 NOTE — Telephone Encounter (Signed)
Multiple telephone attempts to contact pt and letter mailed to pt home to enroll in cardiac rehab.  No response from patient.

## 2013-08-18 ENCOUNTER — Encounter: Payer: Self-pay | Admitting: Cardiology

## 2013-08-25 ENCOUNTER — Telehealth: Payer: Self-pay | Admitting: Cardiology

## 2013-08-25 NOTE — Telephone Encounter (Signed)
Returned call to patient he stated he needs a follow up appointment with Dr.Jordan.Stated he is doing good.Appointment scheduled with Dr.Jordan 10/02/13 at 3:00 pm.Advised to call sooner if needed.

## 2013-08-25 NOTE — Telephone Encounter (Signed)
New message         There are no more slots open for Dr SwazilandJordan. Pt was suppose to be seen in January. Do you need to see this pt and if so where can we put him on the schedule?

## 2013-09-09 ENCOUNTER — Other Ambulatory Visit (HOSPITAL_COMMUNITY): Payer: Self-pay | Admitting: Physician Assistant

## 2013-09-16 ENCOUNTER — Other Ambulatory Visit (HOSPITAL_COMMUNITY): Payer: Self-pay | Admitting: Physician Assistant

## 2013-10-02 ENCOUNTER — Encounter: Payer: Self-pay | Admitting: Cardiology

## 2013-10-02 ENCOUNTER — Ambulatory Visit (INDEPENDENT_AMBULATORY_CARE_PROVIDER_SITE_OTHER): Payer: BC Managed Care – PPO | Admitting: Cardiology

## 2013-10-02 VITALS — BP 130/88 | HR 63 | Ht 69.0 in | Wt 229.0 lb

## 2013-10-02 DIAGNOSIS — I2119 ST elevation (STEMI) myocardial infarction involving other coronary artery of inferior wall: Secondary | ICD-10-CM

## 2013-10-02 DIAGNOSIS — E782 Mixed hyperlipidemia: Secondary | ICD-10-CM

## 2013-10-02 DIAGNOSIS — I2510 Atherosclerotic heart disease of native coronary artery without angina pectoris: Secondary | ICD-10-CM

## 2013-10-02 MED ORDER — CLOPIDOGREL BISULFATE 75 MG PO TABS: 75.0000 mg | ORAL_TABLET | Freq: Every day | ORAL | Status: DC

## 2013-10-02 NOTE — Patient Instructions (Signed)
Stop Effient.  Start Plavix 75 mg daily instead  Continue your other therapy  I will see you in 6 months

## 2013-10-02 NOTE — Progress Notes (Signed)
Roy MunroEddie Chisum Date of Birth: 09/16/1960 Medical Record #[id number]  History of Present Illness: Mr. Roy Douglas is seen for followup after  an acute inferior ST elevation MI on 08/28/2012. He underwent emergent stenting of the right coronary with drug-eluting stents. The segment of disease in the right coronary was fairly long and it required 2 stents to adequately cover. He otherwise had nonobstructive disease. Left ventricular function was mildly reduced by cath at 45-50%. By subsequent echocardiogram it was 50-55%. He continues to do very well since. He has had no recurrent chest pain or shortness of breath. He did develop a swollen painful right knee that had to be drained and injected with cortisone. This has limited his exercise. He feels great overall.   Current Outpatient Prescriptions on File Prior to Visit  Medication Sig Dispense Refill  . aspirin 81 MG tablet Take 1 tablet (81 mg total) by mouth daily.  30 tablet    . atorvastatin (LIPITOR) 80 MG tablet TAKE 1 TABLET (80 MG TOTAL) BY MOUTH DAILY.  30 tablet  0  . LORazepam (ATIVAN) 0.5 MG tablet Take 0.5 mg by mouth daily as needed for anxiety.      . metoprolol tartrate (LOPRESSOR) 25 MG tablet Take 1 tablet (25 mg total) by mouth 2 (two) times daily.  60 tablet  11  . Multiple Vitamins-Minerals (MULTIVITAMIN WITH MINERALS) tablet Take 1 tablet by mouth daily.      . nitroGLYCERIN (NITROSTAT) 0.4 MG SL tablet Place 1 tablet (0.4 mg total) under the tongue every 5 (five) minutes as needed for chest pain.  25 tablet  3  . Omega-3 Fatty Acids (FISH OIL CONCENTRATE PO) Take 1 capsule by mouth daily.       No current facility-administered medications on file prior to visit.    Allergies  Allergen Reactions  . Keflex [Cephalexin]     Mental changes    Past Medical History  Diagnosis Date  . GERD (gastroesophageal reflux disease)   . Gout   . CAD (coronary artery disease)   . Subsequent st elevation (stemi) myocardial infarction of  inferior wall     stent RCA DES 08/28/12  . Hyperlipidemia     Past Surgical History  Procedure Laterality Date  . Knee surgery    . Ankle surgery      History  Smoking status  . Former Smoker  . Types: Cigarettes  . Quit date: 07/30/2011  Smokeless tobacco  . Not on file    History  Alcohol Use No    History reviewed. No pertinent family history.  Review of Systems: As noted in history of present illness.  All other systems were reviewed and are negative.  Physical Exam: BP 130/88  Pulse 63  Ht 5\' 9"  (1.753 m)  Wt 229 lb (103.874 kg)  BMI 33.80 kg/m2 He is a young,  white male in no acute distress. HEENT: Normal Neck: No JVD or bruits. No adenopathy or thyromegaly. Lungs: Clear Cardiovascular: Regular rate and rhythm, normal S1 and S2, no gallop, murmur, or click. Abdomen: Soft and nontender. No masses or bruits. Bowel sounds positive. Extremities:  pulses are 2+. No edema. Surgical scars over both knees. Neuro: Alert and oriented x3. Cranial nerves II through XII are intact. Skin: Warm and dry.  LABORATORY DATA: Ecg: NSR rate 63 bpm. Normal.  Assessment / Plan: 1. Coronary disease status post recent inferior STEMI status post extensive stenting of the right coronary with drug-eluting stents in April 2014. He has  completed one year of DAPT with ASA and Effient. Given the length of stented artery and presentation with acute MI I have recommended continued DAPT but will switch Effient to Plavix due to cost. Continue beta blocker therapy and statin therapy.  2. Dyslipidemia. On Lipitor. Dr. Foy GuadalajaraFried scheduled to do fasting lab work in one month. Continue lifestyle modification.  I will follow up in 6 months.

## 2013-11-20 ENCOUNTER — Other Ambulatory Visit: Payer: Self-pay | Admitting: *Deleted

## 2013-11-20 MED ORDER — METOPROLOL TARTRATE 25 MG PO TABS: 25.0000 mg | ORAL_TABLET | Freq: Two times a day (BID) | ORAL | Status: DC

## 2013-11-20 MED ORDER — ATORVASTATIN CALCIUM 80 MG PO TABS: ORAL_TABLET | ORAL | Status: DC

## 2014-03-22 ENCOUNTER — Encounter: Payer: Self-pay | Admitting: *Deleted

## 2014-04-23 ENCOUNTER — Encounter (HOSPITAL_COMMUNITY): Payer: Self-pay | Admitting: Cardiology

## 2014-04-27 ENCOUNTER — Other Ambulatory Visit: Payer: Self-pay

## 2014-04-27 MED ORDER — ATORVASTATIN CALCIUM 80 MG PO TABS: ORAL_TABLET | ORAL | Status: DC

## 2014-05-06 ENCOUNTER — Other Ambulatory Visit: Payer: Self-pay | Admitting: *Deleted

## 2014-05-06 MED ORDER — ATORVASTATIN CALCIUM 80 MG PO TABS: ORAL_TABLET | ORAL | Status: DC

## 2014-10-28 ENCOUNTER — Other Ambulatory Visit: Payer: Self-pay | Admitting: *Deleted

## 2014-10-28 MED ORDER — CLOPIDOGREL BISULFATE 75 MG PO TABS: 75.0000 mg | ORAL_TABLET | Freq: Every day | ORAL | Status: DC

## 2014-10-29 ENCOUNTER — Other Ambulatory Visit: Payer: Self-pay

## 2014-10-29 MED ORDER — CLOPIDOGREL BISULFATE 75 MG PO TABS: 75.0000 mg | ORAL_TABLET | Freq: Every day | ORAL | Status: DC

## 2014-10-29 NOTE — Addendum Note (Signed)
Addended by: Neta Ehlers on: 10/29/2014 05:03 PM   Modules accepted: Orders

## 2015-01-01 ENCOUNTER — Telehealth: Payer: Self-pay | Admitting: Cardiology

## 2015-01-01 MED ORDER — CLOPIDOGREL BISULFATE 75 MG PO TABS: 75.0000 mg | ORAL_TABLET | Freq: Every day | ORAL | Status: DC

## 2015-01-01 MED ORDER — METOPROLOL TARTRATE 25 MG PO TABS: 25.0000 mg | ORAL_TABLET | Freq: Two times a day (BID) | ORAL | Status: DC

## 2015-01-01 MED ORDER — ATORVASTATIN CALCIUM 80 MG PO TABS: ORAL_TABLET | ORAL | Status: DC

## 2015-01-01 NOTE — Telephone Encounter (Signed)
Refills ordered, pt notified - advised to fast day of visit, can order labwork when he comes in and call w/ results post-visit. Pt agreeable to plan.

## 2015-01-01 NOTE — Telephone Encounter (Signed)
Roy Douglas is calling because he is asking if he needs to have lab work done before his appt on 01/15/15 at 8:30am  If labs are needed please mail the lab order.    1. Which medications need to be refilled? Plavix, Lopressor, Lipitor   2. Which pharmacy is medication to be sent to?[facility] in [place]  3. Do they need a 30 day or 90 day supply?90  4. Would they like a call back once the medication has been sent to the pharmacy? Yes

## 2015-01-04 ENCOUNTER — Other Ambulatory Visit: Payer: Self-pay | Admitting: *Deleted

## 2015-01-05 ENCOUNTER — Other Ambulatory Visit: Payer: Self-pay | Admitting: *Deleted

## 2015-01-15 ENCOUNTER — Encounter: Payer: Self-pay | Admitting: Cardiology

## 2015-01-15 ENCOUNTER — Ambulatory Visit (INDEPENDENT_AMBULATORY_CARE_PROVIDER_SITE_OTHER): Payer: BC Managed Care – PPO | Admitting: Cardiology

## 2015-01-15 VITALS — BP 144/86 | HR 83 | Ht 69.0 in | Wt 235.9 lb

## 2015-01-15 DIAGNOSIS — I25119 Atherosclerotic heart disease of native coronary artery with unspecified angina pectoris: Secondary | ICD-10-CM | POA: Diagnosis not present

## 2015-01-15 DIAGNOSIS — E782 Mixed hyperlipidemia: Secondary | ICD-10-CM

## 2015-01-15 DIAGNOSIS — R0789 Other chest pain: Secondary | ICD-10-CM

## 2015-01-15 DIAGNOSIS — R0989 Other specified symptoms and signs involving the circulatory and respiratory systems: Secondary | ICD-10-CM

## 2015-01-15 LAB — CBC WITH DIFFERENTIAL/PLATELET
BASOS ABS: 0 10*3/uL (ref 0.0–0.1)
Basophils Relative: 0 % (ref 0–1)
Eosinophils Absolute: 0.1 10*3/uL (ref 0.0–0.7)
Eosinophils Relative: 2 % (ref 0–5)
HEMATOCRIT: 43.4 % (ref 39.0–52.0)
HEMOGLOBIN: 14.6 g/dL (ref 13.0–17.0)
LYMPHS ABS: 1.2 10*3/uL (ref 0.7–4.0)
LYMPHS PCT: 16 % (ref 12–46)
MCH: 30.5 pg (ref 26.0–34.0)
MCHC: 33.6 g/dL (ref 30.0–36.0)
MCV: 90.6 fL (ref 78.0–100.0)
MONO ABS: 0.6 10*3/uL (ref 0.1–1.0)
MPV: 12 fL (ref 8.6–12.4)
Monocytes Relative: 8 % (ref 3–12)
NEUTROS ABS: 5.4 10*3/uL (ref 1.7–7.7)
Neutrophils Relative %: 74 % (ref 43–77)
Platelets: 266 10*3/uL (ref 150–400)
RBC: 4.79 MIL/uL (ref 4.22–5.81)
RDW: 13 % (ref 11.5–15.5)
WBC: 7.3 10*3/uL (ref 4.0–10.5)

## 2015-01-15 LAB — LIPID PANEL
Cholesterol: 143 mg/dL (ref 125–200)
HDL: 30 mg/dL — AB (ref >=40)
LDL CALC: 83 mg/dL (ref <130)
TRIGLYCERIDES: 148 mg/dL (ref <150)
Total CHOL/HDL Ratio: 4.8 Ratio (ref <=5.0)
VLDL: 30 mg/dL (ref <30)

## 2015-01-15 LAB — HEPATIC FUNCTION PANEL
ALT: 24 U/L (ref 9–46)
AST: 17 U/L (ref 10–35)
Albumin: 4.1 g/dL (ref 3.6–5.1)
Alkaline Phosphatase: 100 U/L (ref 40–115)
BILIRUBIN DIRECT: 0.1 mg/dL (ref <=0.2)
BILIRUBIN INDIRECT: 0.4 mg/dL (ref 0.2–1.2)
Total Bilirubin: 0.5 mg/dL (ref 0.2–1.2)
Total Protein: 7 g/dL (ref 6.1–8.1)

## 2015-01-15 LAB — BASIC METABOLIC PANEL
BUN: 13 mg/dL (ref 7–25)
CALCIUM: 9 mg/dL (ref 8.6–10.3)
CO2: 27 mmol/L (ref 20–31)
CREATININE: 1.01 mg/dL (ref 0.70–1.33)
Chloride: 104 mmol/L (ref 98–110)
Glucose, Bld: 107 mg/dL — ABNORMAL HIGH (ref 65–99)
Potassium: 4.9 mmol/L (ref 3.5–5.3)
Sodium: 140 mmol/L (ref 135–146)

## 2015-01-15 MED ORDER — NITROGLYCERIN 0.4 MG SL SUBL: 0.4000 mg | SUBLINGUAL_TABLET | SUBLINGUAL | Status: DC | PRN

## 2015-01-15 NOTE — Progress Notes (Signed)
Roy Douglas Date of Birth: 11/11/1960 Medical Record #[id number]  History of Present Illness: Roy Douglas is seen for followup CAD. He had an acute inferior ST elevation MI on 08/28/2012. He underwent emergent stenting of the right coronary with drug-eluting stents. The segment of disease in the right coronary was fairly long and it required 2 stents to adequately cover. He otherwise had nonobstructive disease. Left ventricular function was mildly reduced by cath at 45-50%. By subsequent echocardiogram it was 50-55%. On follow up today he notes occasional pain in his chest like an ache. This is not associated with exertion. He lifts 150 lb kegs daily and has no discomfort with this. He notes he was eating poorly but has been doing better with this recently.   Current Outpatient Prescriptions on File Prior to Visit  Medication Sig Dispense Refill  . aspirin 81 MG tablet Take 1 tablet (81 mg total) by mouth daily. 30 tablet   . atorvastatin (LIPITOR) 80 MG tablet TAKE 1 TABLET (80 MG TOTAL) BY MOUTH DAILY. 90 tablet 0  . clopidogrel (PLAVIX) 75 MG tablet Take 1 tablet (75 mg total) by mouth daily with breakfast. 90 tablet 0  . LORazepam (ATIVAN) 0.5 MG tablet Take 0.5 mg by mouth daily as needed for anxiety.    . metoprolol tartrate (LOPRESSOR) 25 MG tablet Take 1 tablet (25 mg total) by mouth 2 (two) times daily. 180 tablet 0  . Multiple Vitamins-Minerals (MULTIVITAMIN WITH MINERALS) tablet Take 1 tablet by mouth daily.    . Omega-3 Fatty Acids (FISH OIL CONCENTRATE PO) Take 1 capsule by mouth daily.     No current facility-administered medications on file prior to visit.    Allergies  Allergen Reactions  . Keflex [Cephalexin]     Mental changes    Past Medical History  Diagnosis Date  . GERD (gastroesophageal reflux disease)   . Gout   . CAD (coronary artery disease)   . Subsequent st elevation (stemi) myocardial infarction of inferior wall     stent RCA DES 08/28/12  .  Hyperlipidemia     Past Surgical History  Procedure Laterality Date  . Knee surgery    . Ankle surgery    . Left heart catheterization with coronary angiogram N/A 08/28/2012    Procedure: LEFT HEART CATHETERIZATION WITH CORONARY ANGIOGRAM;  Surgeon: Peter M Swaziland, MD;  Location: [facility] CATH LAB;  Service: Cardiovascular;  Laterality: N/A;    History  Smoking status  . Former Smoker  . Types: Cigarettes  . Quit date: 07/30/2011  Smokeless tobacco  . Not on file    History  Alcohol Use No    Family History  Problem Relation Age of Onset  . Peripheral vascular disease Mother   . Hyperlipidemia Sister   . Hyperlipidemia Brother     Review of Systems: As noted in history of present illness.  All other systems were reviewed and are negative.  Physical Exam: BP 144/86 mmHg  Pulse 83  Ht  (1.753 m)  Wt 107.004 kg (235 lb 14.4 oz)  BMI 34.82 kg/m2 He is a young,  white male in no acute distress. HEENT: Normal Neck: No JVD. Loud high pitched left carotid bruit. No adenopathy or thyromegaly. Lungs: Clear Cardiovascular: Regular rate and rhythm, normal S1 and S2, no gallop, murmur, or click. Abdomen: Soft and nontender. No masses or bruits. Bowel sounds positive. Extremities:  pulses are 2+. No edema. Surgical scars over both knees. Neuro: Alert and oriented x3. Cranial nerves  II through XII are intact. Skin: Warm and dry.  LABORATORY DATA: Ecg: today- NSR rate 84 bpm. Normal. I have personally reviewed and interpreted this study.   Assessment / Plan: 1. Coronary disease status post inferior STEMI status post extensive stenting of the right coronary with drug-eluting stents in April 2014.  Given the length of stented artery and presentation with acute MI I have recommended continued DAPT with ASA and Plavix. Continue beta blocker therapy and statin therapy. He is experiencing atypical chest pain. Will arrange for an ETT.  2. Dyslipidemia. On Lipitor. Will check fasting  lab work today.  3. Left carotid bruit. Will schedule carotid dopplers.

## 2015-01-15 NOTE — Patient Instructions (Signed)
We will check blood work today  We will schedule you for carotid dopplers and a stress test

## 2015-02-10 ENCOUNTER — Telehealth (HOSPITAL_COMMUNITY): Payer: Self-pay

## 2015-02-10 NOTE — Telephone Encounter (Signed)
Encounter complete. 

## 2015-02-12 ENCOUNTER — Ambulatory Visit (HOSPITAL_COMMUNITY)
Admission: RE | Admit: 2015-02-12 | Discharge: 2015-02-12 | Disposition: A | Discharge status: Home / Self Care | Payer: BC Managed Care – PPO | Source: Ambulatory Visit | Attending: Cardiology | Admitting: Cardiology

## 2015-02-12 ENCOUNTER — Ambulatory Visit (HOSPITAL_BASED_OUTPATIENT_CLINIC_OR_DEPARTMENT_OTHER)
Admission: RE | Admit: 2015-02-12 | Discharge: 2015-02-12 | Disposition: A | Discharge status: Home / Self Care | Payer: BC Managed Care – PPO | Source: Ambulatory Visit | Attending: Cardiology | Admitting: Cardiology

## 2015-02-12 DIAGNOSIS — E782 Mixed hyperlipidemia: Secondary | ICD-10-CM | POA: Insufficient documentation

## 2015-02-12 DIAGNOSIS — R0989 Other specified symptoms and signs involving the circulatory and respiratory systems: Secondary | ICD-10-CM | POA: Diagnosis not present

## 2015-02-12 DIAGNOSIS — R0789 Other chest pain: Secondary | ICD-10-CM | POA: Insufficient documentation

## 2015-02-12 DIAGNOSIS — I25119 Atherosclerotic heart disease of native coronary artery with unspecified angina pectoris: Secondary | ICD-10-CM

## 2015-02-12 DIAGNOSIS — I6523 Occlusion and stenosis of bilateral carotid arteries: Secondary | ICD-10-CM | POA: Insufficient documentation

## 2015-02-12 LAB — EXERCISE TOLERANCE TEST
CHL CUP STRESS STAGE 1 GRADE: 0 %
CHL CUP STRESS STAGE 1 HR: 88 {beats}/min
CHL CUP STRESS STAGE 1 SBP: 162 mmHg
CHL CUP STRESS STAGE 1 SPEED: 0 mph
CHL CUP STRESS STAGE 2 GRADE: 0 %
CHL CUP STRESS STAGE 2 HR: 83 {beats}/min
CHL CUP STRESS STAGE 3 GRADE: 0 %
CHL CUP STRESS STAGE 4 HR: 111 {beats}/min
CHL CUP STRESS STAGE 4 SPEED: 1.7 mph
CHL CUP STRESS STAGE 5 DBP: 89 mmHg
CHL CUP STRESS STAGE 5 GRADE: 12 %
CHL CUP STRESS STAGE 5 SPEED: 2.5 mph
CHL CUP STRESS STAGE 6 HR: 151 {beats}/min
CHL CUP STRESS STAGE 6 SBP: 215 mmHg
CHL CUP STRESS STAGE 6 SPEED: 3.3 mph
CHL CUP STRESS STAGE 7 DBP: 113 mmHg
CHL CUP STRESS STAGE 7 GRADE: 16 %
CHL CUP STRESS STAGE 7 SBP: 226 mmHg
CHL CUP STRESS STAGE 7 SPEED: 4.2 mph
CHL CUP STRESS STAGE 8 HR: 141 {beats}/min
CHL CUP STRESS STAGE 8 SBP: 203 mmHg
CSEPHR: 101 %
CSEPPMHR: 101 %
Estimated workload: 13.4 METS
Exercise duration (min): 11 min
Exercise duration (sec): 31 s
MPHR: 168 {beats}/min
Peak BP: 226 mmHg
Peak HR: 171 {beats}/min
RPE: 15
Rest HR: 82 {beats}/min
Stage 1 DBP: 95 mmHg
Stage 2 Speed: 1 mph
Stage 3 HR: 83 {beats}/min
Stage 3 Speed: 1 mph
Stage 4 DBP: 94 mmHg
Stage 4 Grade: 10 %
Stage 4 SBP: 197 mmHg
Stage 5 HR: 130 {beats}/min
Stage 5 SBP: 189 mmHg
Stage 6 DBP: 105 mmHg
Stage 6 Grade: 14 %
Stage 7 HR: 171 {beats}/min
Stage 8 DBP: 108 mmHg
Stage 8 Grade: 0 %
Stage 8 Speed: 0 mph
Stage 9 DBP: 94 mmHg
Stage 9 Grade: 0 %
Stage 9 HR: 95 {beats}/min
Stage 9 SBP: 170 mmHg
Stage 9 Speed: 0 mph

## 2015-02-17 ENCOUNTER — Other Ambulatory Visit: Payer: Self-pay

## 2015-02-17 DIAGNOSIS — I6522 Occlusion and stenosis of left carotid artery: Secondary | ICD-10-CM

## 2015-02-17 DIAGNOSIS — R0989 Other specified symptoms and signs involving the circulatory and respiratory systems: Secondary | ICD-10-CM

## 2015-04-12 ENCOUNTER — Other Ambulatory Visit: Payer: Self-pay | Admitting: Cardiology

## 2015-04-12 NOTE — Telephone Encounter (Signed)
°*  STAT* If patient is at the pharmacy, call can be transferred to refill team.   1. Which medications need to be refilled? (please list name of each medication and dose if known)Generic Lipitor and Plavix  2. Which pharmacy/location (including street and city if local pharmacy) is medication to be sent to?[facility] 3. Do they need a 30 day or 90 day supply? 30 and refills

## 2015-04-13 MED ORDER — ATORVASTATIN CALCIUM 80 MG PO TABS: 80.0000 mg | ORAL_TABLET | Freq: Every day | ORAL | Status: DC

## 2015-04-13 MED ORDER — CLOPIDOGREL BISULFATE 75 MG PO TABS: 75.0000 mg | ORAL_TABLET | Freq: Every day | ORAL | Status: DC

## 2015-04-13 NOTE — Telephone Encounter (Signed)
Rx(s) sent to pharmacy electronically.  

## 2015-09-24 ENCOUNTER — Other Ambulatory Visit: Payer: Self-pay | Admitting: Cardiology

## 2015-09-24 NOTE — Telephone Encounter (Signed)
Rx(s) sent to pharmacy electronically.  

## 2015-11-27 ENCOUNTER — Emergency Department (HOSPITAL_COMMUNITY): Payer: BC Managed Care – PPO

## 2015-11-27 ENCOUNTER — Emergency Department (HOSPITAL_COMMUNITY)
Admission: EM | Admit: 2015-11-27 | Discharge: 2015-11-27 | Disposition: A | Discharge status: Home / Self Care | Payer: BC Managed Care – PPO | Attending: Emergency Medicine | Admitting: Emergency Medicine

## 2015-11-27 ENCOUNTER — Encounter (HOSPITAL_COMMUNITY): Payer: Self-pay | Admitting: Emergency Medicine

## 2015-11-27 DIAGNOSIS — Z87891 Personal history of nicotine dependence: Secondary | ICD-10-CM | POA: Diagnosis not present

## 2015-11-27 DIAGNOSIS — Z79899 Other long term (current) drug therapy: Secondary | ICD-10-CM | POA: Diagnosis not present

## 2015-11-27 DIAGNOSIS — I252 Old myocardial infarction: Secondary | ICD-10-CM | POA: Diagnosis not present

## 2015-11-27 DIAGNOSIS — E785 Hyperlipidemia, unspecified: Secondary | ICD-10-CM | POA: Diagnosis not present

## 2015-11-27 DIAGNOSIS — R109 Unspecified abdominal pain: Secondary | ICD-10-CM | POA: Diagnosis present

## 2015-11-27 DIAGNOSIS — I2510 Atherosclerotic heart disease of native coronary artery without angina pectoris: Secondary | ICD-10-CM | POA: Diagnosis not present

## 2015-11-27 DIAGNOSIS — Z7982 Long term (current) use of aspirin: Secondary | ICD-10-CM | POA: Diagnosis not present

## 2015-11-27 DIAGNOSIS — N200 Calculus of kidney: Secondary | ICD-10-CM | POA: Diagnosis not present

## 2015-11-27 LAB — COMPREHENSIVE METABOLIC PANEL
ALBUMIN: 4.5 g/dL (ref 3.5–5.0)
ALT: 34 U/L (ref 17–63)
ANION GAP: 9 (ref 5–15)
AST: 23 U/L (ref 15–41)
Alkaline Phosphatase: 99 U/L (ref 38–126)
BUN: 14 mg/dL (ref 6–20)
CHLORIDE: 105 mmol/L (ref 101–111)
CO2: 22 mmol/L (ref 22–32)
Calcium: 9.1 mg/dL (ref 8.9–10.3)
Creatinine, Ser: 1.25 mg/dL — ABNORMAL HIGH (ref 0.61–1.24)
GFR calc Af Amer: >60 mL/min (ref >60)
Glucose, Bld: 156 mg/dL — ABNORMAL HIGH (ref 65–99)
POTASSIUM: 4 mmol/L (ref 3.5–5.1)
Sodium: 136 mmol/L (ref 135–145)
Total Bilirubin: 0.7 mg/dL (ref 0.3–1.2)
Total Protein: 7.6 g/dL (ref 6.5–8.1)

## 2015-11-27 LAB — CBC
HEMATOCRIT: 42.8 % (ref 39.0–52.0)
HEMOGLOBIN: 14.4 g/dL (ref 13.0–17.0)
MCH: 29.9 pg (ref 26.0–34.0)
MCHC: 33.6 g/dL (ref 30.0–36.0)
MCV: 89 fL (ref 78.0–100.0)
Platelets: 270 10*3/uL (ref 150–400)
RBC: 4.81 MIL/uL (ref 4.22–5.81)
RDW: 12.6 % (ref 11.5–15.5)
WBC: 14.7 10*3/uL — ABNORMAL HIGH (ref 4.0–10.5)

## 2015-11-27 LAB — URINALYSIS, ROUTINE W REFLEX MICROSCOPIC
Bilirubin Urine: NEGATIVE
GLUCOSE, UA: NEGATIVE mg/dL
Ketones, ur: NEGATIVE mg/dL
Leukocytes, UA: NEGATIVE
Nitrite: NEGATIVE
Protein, ur: NEGATIVE mg/dL
SPECIFIC GRAVITY, URINE: 1.028 (ref 1.005–1.030)
pH: 5 (ref 5.0–8.0)

## 2015-11-27 LAB — URINE MICROSCOPIC-ADD ON

## 2015-11-27 LAB — LIPASE, BLOOD: LIPASE: 19 U/L (ref 11–51)

## 2015-11-27 MED ORDER — ONDANSETRON HCL 4 MG/2ML IJ SOLN
4.0000 mg | Freq: Once | INTRAMUSCULAR | Status: AC
  Administered 2015-11-27: 4 mg via INTRAVENOUS
  Filled 2015-11-27: qty 2

## 2015-11-27 MED ORDER — KETOROLAC TROMETHAMINE 30 MG/ML IJ SOLN
15.0000 mg | Freq: Once | INTRAMUSCULAR | Status: AC
  Administered 2015-11-27: 15 mg via INTRAVENOUS
  Filled 2015-11-27: qty 1

## 2015-11-27 MED ORDER — HYDROMORPHONE HCL 1 MG/ML IJ SOLN
1.0000 mg | Freq: Once | INTRAMUSCULAR | Status: AC
  Administered 2015-11-27: 1 mg via INTRAVENOUS
  Filled 2015-11-27: qty 1

## 2015-11-27 MED ORDER — OXYCODONE-ACETAMINOPHEN 5-325 MG PO TABS: 1.0000 | ORAL_TABLET | ORAL | Status: DC | PRN

## 2015-11-27 MED ORDER — TAMSULOSIN HCL 0.4 MG PO CAPS: 0.4000 mg | ORAL_CAPSULE | Freq: Every day | ORAL | Status: DC

## 2015-11-27 NOTE — Discharge Instructions (Signed)
Kidney Stones °Kidney stones (urolithiasis) are deposits that form inside your kidneys. The intense pain is caused by the stone moving through the urinary tract. When the stone moves, the ureter goes into spasm around the stone. The stone is usually passed in the urine.  °CAUSES  °· A disorder that makes certain neck glands produce too much parathyroid hormone (primary hyperparathyroidism). °· A buildup of uric acid crystals, similar to gout in your joints. °· Narrowing (stricture) of the ureter. °· A kidney obstruction present at birth (congenital obstruction). °· Previous surgery on the kidney or ureters. °· Numerous kidney infections. °SYMPTOMS  °· Feeling sick to your stomach (nauseous). °· Throwing up (vomiting). °· Blood in the urine (hematuria). °· Pain that usually spreads (radiates) to the groin. °· Frequency or urgency of urination. °DIAGNOSIS  °· Taking a history and physical exam. °· Blood or urine tests. °· CT scan. °· Occasionally, an examination of the inside of the urinary bladder (cystoscopy) is performed. °TREATMENT  °· Observation. °· Increasing your fluid intake. °· Extracorporeal shock wave lithotripsy--This is a noninvasive procedure that uses shock waves to break up kidney stones. °· Surgery may be needed if you have severe pain or persistent obstruction. There are various surgical procedures. Most of the procedures are performed with the use of small instruments. Only small incisions are needed to accommodate these instruments, so recovery time is minimized. °The size, location, and chemical composition are all important variables that will determine the proper choice of action for you. Talk to your health care provider to better understand your situation so that you will minimize the risk of injury to yourself and your kidney.  °HOME CARE INSTRUCTIONS  °· Drink enough water and fluids to keep your urine clear or pale yellow. This will help you to pass the stone or stone fragments. °· Strain  all urine through the provided strainer. Keep all particulate matter and stones for your health care provider to see. The stone causing the pain may be as small as a grain of salt. It is very important to use the strainer each and every time you pass your urine. The collection of your stone will allow your health care provider to analyze it and verify that a stone has actually passed. The stone analysis will often identify what you can do to reduce the incidence of recurrences. °· Only take over-the-counter or prescription medicines for pain, discomfort, or fever as directed by your health care provider. °· Keep all follow-up visits as told by your health care provider. This is important. °· Get follow-up X-rays if required. The absence of pain does not always mean that the stone has passed. It may have only stopped moving. If the urine remains completely obstructed, it can cause loss of kidney function or even complete destruction of the kidney. It is your responsibility to make sure X-rays and follow-ups are completed. Ultrasounds of the kidney can show blockages and the status of the kidney. Ultrasounds are not associated with any radiation and can be performed easily in a matter of minutes. °· Make changes to your daily diet as told by your health care provider. You may be told to: °¨ Limit the amount of salt that you eat. °¨ Eat 5 or more servings of fruits and vegetables each day. °¨ Limit the amount of meat, poultry, fish, and eggs that you eat. °· Collect a 24-hour urine sample as told by your health care provider. You may need to collect another urine sample every 6-12   months. °SEEK MEDICAL CARE IF: °· You experience pain that is progressive and unresponsive to any pain medicine you have been prescribed. °SEEK IMMEDIATE MEDICAL CARE IF:  °· Pain cannot be controlled with the prescribed medicine. °· You have a fever or shaking chills. °· The severity or intensity of pain increases over 18 hours and is not  relieved by pain medicine. °· You develop a new onset of abdominal pain. °· You feel faint or pass out. °· You are unable to urinate. °  °This information is not intended to replace advice given to you by your health care provider. Make sure you discuss any questions you have with your health care provider. °  °Document Released: 05/01/2005 Document Revised: 01/20/2015 Document Reviewed: 10/02/2012 °Elsevier Interactive Patient Education ©2016 [employer] ° °

## 2015-11-27 NOTE — ED Provider Notes (Signed)
CSN: [id number]     Arrival date & time 11/27/15  1726 History   First MD Initiated Contact with Patient 11/27/15 1808     Chief Complaint  Patient presents with  . Abdominal Pain      HPI He developed a red, fine red very mildly pruritic rash at upper trunk/neck area ~5 minutes after having received IV meds--Dr. Radford Pax notified. No oral or throat swelling and no bronchospasm present. His wife remains at bedside. Past Medical History  Diagnosis Date  . GERD (gastroesophageal reflux disease)   . Gout   . CAD (coronary artery disease)   . Subsequent ST elevation (STEMI) myocardial infarction of inferior wall (HCC)     stent RCA DES 08/28/12  . Hyperlipidemia    Past Surgical History  Procedure Laterality Date  . Knee surgery    . Ankle surgery    . Left heart catheterization with coronary angiogram N/A 08/28/2012    Procedure: LEFT HEART CATHETERIZATION WITH CORONARY ANGIOGRAM;  Surgeon: Peter M Swaziland, MD;  Location: [facility] CATH LAB;  Service: Cardiovascular;  Laterality: N/A;   Family History  Problem Relation Age of Onset  . Peripheral vascular disease Mother   . Hyperlipidemia Sister   . Hyperlipidemia Brother    Social History  Substance Use Topics  . Smoking status: Former Smoker    Types: Cigarettes    Quit date: 07/30/2011  . Smokeless tobacco: None  . Alcohol Use: No    Review of Systems  All other systems reviewed and are negative  Allergies  Keflex  Home Medications   Prior to Admission medications   Medication Sig Start Date End Date Taking? Authorizing Provider  aspirin 81 MG tablet Take 1 tablet (81 mg total) by mouth daily. 08/29/12  Yes Rhonda G Barrett, PA-C  atorvastatin (LIPITOR) 80 MG tablet Take 1 tablet (80 mg total) by mouth daily at 6 PM. 04/13/15  Yes Peter M Swaziland, MD  clopidogrel (PLAVIX) 75 MG tablet Take 1 tablet (75 mg total) by mouth daily. 04/13/15  Yes Peter M Swaziland, MD  metoprolol tartrate (LOPRESSOR) 25 MG tablet TAKE 1 TABLET (25  MG TOTAL) BY MOUTH 2 (TWO) TIMES DAILY. 09/24/15  Yes Peter M Swaziland, MD  Multiple Vitamins-Minerals (MULTIVITAMIN WITH MINERALS) tablet Take 1 tablet by mouth daily.   Yes Historical Provider, MD  Omega-3 Fatty Acids (FISH OIL CONCENTRATE PO) Take 1 capsule by mouth daily.   Yes Historical Provider, MD  LORazepam (ATIVAN) 0.5 MG tablet Take 0.5 mg by mouth daily as needed for anxiety.    Historical Provider, MD  nitroGLYCERIN (NITROSTAT) 0.4 MG SL tablet Place 1 tablet (0.4 mg total) under the tongue every 5 (five) minutes as needed for chest pain. 01/15/15   Peter M Swaziland, MD  oxyCODONE-acetaminophen (PERCOCET/ROXICET) 5-325 MG tablet Take 1-2 tablets by mouth every 4 (four) hours as needed for severe pain. 11/27/15   Nelva Nay, MD  tamsulosin (FLOMAX) 0.4 MG CAPS capsule Take 1 capsule (0.4 mg total) by mouth daily. 11/27/15   Nelva Nay, MD   BP 141/95 mmHg  Pulse 84  Temp(Src) 98.1 F (36.7 C) (Oral)  Resp 19  SpO2 98% Physical Exam Physical Exam  Nursing note and vitals reviewed. Constitutional: He is oriented to person, place, and time. He appears well-developed and well-nourished.  Appears uncomfortable from left upper quadrant and left flank pain. HENT:  Head: Normocephalic and atraumatic.  Eyes: Pupils are equal, round, and reactive to light.  Neck: Normal range of  motion.  Cardiovascular: Normal rate and intact distal pulses.   Pulmonary/Chest: No respiratory distress.  Abdominal: Normal appearance. He exhibits no distension.  No rebound or guarding tenderness.  No peritoneal signs.   Musculoskeletal: Normal range of motion.  Neurological: He is alert and oriented to person, place, and time. No cranial nerve deficit.  Skin: Skin is warm and dry. No rash noted.  Psychiatric: He has a normal mood and affect. His behavior is normal.   ED Course  Procedures (including critical care time) Medications  HYDROmorphone (DILAUDID) injection 1 mg (1 mg Intravenous Given 11/27/15  1833)  ondansetron (ZOFRAN) injection 4 mg (4 mg Intravenous Given 11/27/15 1834)  ketorolac (TORADOL) 30 MG/ML injection 15 mg (15 mg Intravenous Given 11/27/15 1910)   After receiving the hydromorphone the patient did develop a rash that was macular but no evidence of urticaria.  Patient denies any itching or difficulty swallowing.  The rash lasted approximately 15-20 minutes and then dissipated on its own. Labs Review Labs Reviewed  COMPREHENSIVE METABOLIC PANEL - Abnormal; Notable for the following:    Glucose, Bld 156 (*)    Creatinine, Ser 1.25 (*)    All other components within normal limits  CBC - Abnormal; Notable for the following:    WBC 14.7 (*)    All other components within normal limits  URINALYSIS, ROUTINE W REFLEX MICROSCOPIC (NOT AT [facility]) - Abnormal; Notable for the following:    Hgb urine dipstick LARGE (*)    All other components within normal limits  URINE MICROSCOPIC-ADD ON - Abnormal; Notable for the following:    Squamous Epithelial / LPF 0-5 (*)    Bacteria, UA FEW (*)    Crystals CA OXALATE CRYSTALS (*)    All other components within normal limits  LIPASE, BLOOD    Imaging Review Dg Abd 1 View  11/27/2015  CLINICAL DATA:  Pt reports constant left abdominal pain worse post eating. H/o GERD. EXAM: ABDOMEN - 1 VIEW COMPARISON:  None. FINDINGS: Bowel gas pattern is nonobstructive. Moderate stool burden is noted throughout the colon including ascending portion. Moderate degenerative changes identified in the thoracolumbar spine and hips. IMPRESSION: No evidence for acute  abnormality.  Moderate stool burden. Electronically Signed   By: Norva PavlovElizabeth  Brown M.D.   On: 11/27/2015 18:47   Ct Renal Stone Study  11/27/2015  CLINICAL DATA:  55 year old male with abdominal pain and hematuria. EXAM: CT ABDOMEN AND PELVIS WITHOUT CONTRAST TECHNIQUE: Multidetector CT imaging of the abdomen and pelvis was performed following the standard protocol without IV contrast. COMPARISON:   Abdominal radiograph dated 11/27/2015 FINDINGS: Evaluation of this exam is limited in the absence of intravenous contrast. The visualized lung bases are clear. There is coronary vascular calcification. No intra-abdominal free air or free fluid. Mild diffuse fatty infiltration of the liver. The gallbladder, pancreas, spleen, adrenal glands appear unremarkable. There are nonobstructing bilateral renal calculi measuring up to 5 mm in the interpolar aspect of the left kidney. There is no hydronephrosis on the right. There is a 5 mm distal left ureteral stone with mild left hydronephrosis. The urinary bladder is only partially distended and appears grossly unremarkable. The prostate and seminal vesicles are grossly unremarkable. There is sigmoid diverticulosis without active inflammatory changes. There is no evidence of bowel obstruction or active inflammation. Normal appendix. There is moderate aortoiliac atherosclerotic disease. The abdominal aorta and IVC are otherwise grossly unremarkable on this noncontrast study. No portal venous gas identified. There is no adenopathy. The abdominal wall  soft tissues appear unremarkable. There is mild degenerative changes of the spine. No acute fracture. IMPRESSION: A 5 mm distal left ureteral stone with mild left hydronephrosis. Electronically Signed   By: Elgie Collard M.D.   On: 11/27/2015 19:47   I have personally reviewed and evaluated these images and lab results as part of my medical decision-making.   EKG Interpretation   Date/Time:  Saturday November 27 2015 18:51:04 EDT Ventricular Rate:  84 PR Interval:    QRS Duration: 90 QT Interval:  389 QTC Calculation: 460 R Axis:   72 Text Interpretation:  Sinus rhythm Abnormal R-wave progression, early  transition Otherwise no significant change Confirmed by Aj Crunkleton  MD, Derwood Becraft  ([id number]) on 11/27/2015 7:06:39 PM     After treatment in the ED the patient feels back to baseline and wants to go home. MDM   Final  diagnoses:  Kidney stone        Nelva Nay, MD 11/27/15 2017

## 2015-11-27 NOTE — ED Notes (Signed)
Pt reports constant left abdominal pain worse post eating. Denies associated symptoms.

## 2015-11-27 NOTE — ED Notes (Signed)
He developed a red, fine red very mildly pruritic rash at upper trunk/neck area ~5 minutes after having received IV meds--Dr. Radford PaxBeaton notified.  No oral or throat swelling and no bronchospasm present.  His wife remains at bedside.

## 2016-01-26 ENCOUNTER — Other Ambulatory Visit: Payer: Self-pay | Admitting: Cardiology

## 2016-03-31 ENCOUNTER — Other Ambulatory Visit: Payer: Self-pay | Admitting: Cardiology

## 2016-03-31 NOTE — Telephone Encounter (Signed)
Rx(s) sent to pharmacy electronically.  

## 2016-09-26 ENCOUNTER — Ambulatory Visit (INDEPENDENT_AMBULATORY_CARE_PROVIDER_SITE_OTHER): Payer: Managed Care, Other (non HMO) | Admitting: Cardiology

## 2016-09-26 ENCOUNTER — Encounter: Payer: Self-pay | Admitting: Cardiology

## 2016-09-26 VITALS — BP 140/84 | HR 86 | Ht 69.0 in | Wt 246.0 lb

## 2016-09-26 DIAGNOSIS — E782 Mixed hyperlipidemia: Secondary | ICD-10-CM | POA: Diagnosis not present

## 2016-09-26 DIAGNOSIS — R0989 Other specified symptoms and signs involving the circulatory and respiratory systems: Secondary | ICD-10-CM | POA: Diagnosis not present

## 2016-09-26 DIAGNOSIS — I779 Disorder of arteries and arterioles, unspecified: Secondary | ICD-10-CM | POA: Diagnosis not present

## 2016-09-26 DIAGNOSIS — I25119 Atherosclerotic heart disease of native coronary artery with unspecified angina pectoris: Secondary | ICD-10-CM | POA: Diagnosis not present

## 2016-09-26 DIAGNOSIS — I739 Peripheral vascular disease, unspecified: Secondary | ICD-10-CM

## 2016-09-26 MED ORDER — CLOPIDOGREL BISULFATE 75 MG PO TABS: 75.0000 mg | ORAL_TABLET | Freq: Every day | ORAL | 3 refills | Status: DC

## 2016-09-26 MED ORDER — ATORVASTATIN CALCIUM 80 MG PO TABS: 80.0000 mg | ORAL_TABLET | Freq: Every day | ORAL | 3 refills | Status: DC

## 2016-09-26 NOTE — Patient Instructions (Signed)
Stop metoprolol. Monitor BP at home. Would like to keep less than 130/80  We will check lab work today.  Add more aerobic activity  We will check carotid dopplers  I will see you in 6 months.

## 2016-09-26 NOTE — Progress Notes (Signed)
Roy Douglas Date of Birth: 05/30/1960 Medical Record #[id number]  History of Present Illness: Roy Douglas is seen for followup CAD. Last seen in September 2016. He has a history of acute inferior ST elevation MI on 08/28/2012. He underwent emergent stenting of the right coronary with drug-eluting stents. The segment of disease in the right coronary was fairly long and it required 2 stents to adequately cover. He otherwise had nonobstructive disease. Left ventricular function was mildly reduced by cath at 45-50%. By subsequent echocardiogram it was 50-55%. Follow up ETT in Sept. 2016 was normal.   He also has carotid arterial disease with doppler in Sept. 2016 showing a 50-79% left ICA stenosis. He has a bruit.  He has a history of hyperlipidemia.   On follow up today he states he feels great. No chest pain or dyspnea. 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 He does a lot of weight lifting to help with stress. Not as much aerobic activity. BP at home 120/70. Notes some erectile dysfunction.   Current Outpatient Prescriptions on File Prior to Visit  Medication Sig Dispense Refill  . aspirin 81 MG tablet Take 1 tablet (81 mg total) by mouth daily. 30 tablet   . LORazepam (ATIVAN) 0.5 MG tablet Take 0.5 mg by mouth daily as needed for anxiety.    . Multiple Vitamins-Minerals (MULTIVITAMIN WITH MINERALS) tablet Take 1 tablet by mouth daily.    . nitroGLYCERIN (NITROSTAT) 0.4 MG SL tablet Place 1 tablet (0.4 mg total) under the tongue every 5 (five) minutes as needed for chest pain. 25 tablet 3  . Omega-3 Fatty Acids (FISH OIL CONCENTRATE PO) Take 1 capsule by mouth daily.     No current facility-administered medications on file prior to visit.     Allergies  Allergen Reactions  . Keflex [Cephalexin]     Mental changes    Past Medical History:  Diagnosis Date  . CAD (coronary artery disease)   . GERD (gastroesophageal reflux disease)   . Gout   . Hyperlipidemia    . Subsequent ST elevation (STEMI) myocardial infarction of inferior wall (HCC)    stent RCA DES 08/28/12    Past Surgical History:  Procedure Laterality Date  . ANKLE SURGERY    . KNEE SURGERY    . LEFT HEART CATHETERIZATION WITH CORONARY ANGIOGRAM N/A 08/28/2012   Procedure: LEFT HEART CATHETERIZATION WITH CORONARY ANGIOGRAM;  Surgeon: Peter M SwazilandJordan, MD;  [facility];  Service: Cardiovascular;  Laterality: N/A;    History  Smoking Status  . Former Smoker  . Types: Cigarettes  . Quit date: 07/30/2011  Smokeless Tobacco  . Never Used    History  Alcohol Use No    Family History  Problem Relation Age of Onset  . Peripheral vascular disease Mother   . Hyperlipidemia Sister   . Hyperlipidemia Brother     Review of Systems: As noted in history of present illness.  All other systems were reviewed and are negative.  Physical Exam: BP 140/84   Pulse 86   Ht 5\' 9"  (1.753 m)   Wt 246 lb (111.6 kg)   BMI 36.33 kg/m  He is a young, muscular white male in no acute distress. HEENT: Normal Neck: No JVD. Loud high pitched left carotid bruit. No adenopathy or thyromegaly. Lungs: Clear Cardiovascular: Regular rate and rhythm, normal S1 and S2, no gallop, murmur, or click. Abdomen: Soft and nontender. No masses or bruits. Bowel sounds positive. Extremities:  pulses are 2+. No edema. Surgical scars over both knees. Neuro: Alert and oriented x3. Cranial nerves II through XII are intact. Skin: Warm and dry.  LABORATORY DATA:  Lab Results  Component Value Date   WBC 14.7 (H) 11/27/2015   HGB 14.4 11/27/2015   HCT 42.8 11/27/2015   PLT 270 11/27/2015   GLUCOSE 156 (H) 11/27/2015   CHOL 143 01/15/2015   TRIG 148 01/15/2015   HDL 30 (L) 01/15/2015   LDLCALC 83 01/15/2015   ALT 34 11/27/2015   AST 23 11/27/2015   NA 136 11/27/2015   K 4.0 11/27/2015   CL 105 11/27/2015   CREATININE 1.25 (H) 11/27/2015   BUN 14 11/27/2015   CO2 22 11/27/2015   INR 0.90  08/28/2012   HGBA1C 5.7 (H) 08/28/2012      Assessment / Plan: 1. Coronary disease status post inferior STEMI status post extensive stenting of the right coronary with drug-eluting stents in April 2014.  Given the length of stented artery and presentation with acute MI I have recommended continued DAPT with ASA and Plavix. Continue  statin therapy. Given his ED I think we can stop the metoprolol now. Monitor BP at home. Encourage more aerobic activity.   2. Dyslipidemia. On Lipitor. Will check fasting lab work today.  3. Carotid arterial disease with Left carotid bruit. Prior dopplers with 50-79% stenosis. Will update carotid dopplers now.

## 2016-10-12 LAB — HEPATIC FUNCTION PANEL
ALBUMIN: 4.1 g/dL (ref 3.6–5.1)
ALK PHOS: 89 U/L (ref 40–115)
ALT: 27 U/L (ref 9–46)
AST: 18 U/L (ref 10–35)
BILIRUBIN INDIRECT: 0.6 mg/dL (ref 0.2–1.2)
Bilirubin, Direct: 0.1 mg/dL (ref <=0.2)
TOTAL PROTEIN: 7 g/dL (ref 6.1–8.1)
Total Bilirubin: 0.7 mg/dL (ref 0.2–1.2)

## 2016-10-12 LAB — BASIC METABOLIC PANEL
BUN: 12 mg/dL (ref 7–25)
CO2: 25 mmol/L (ref 20–31)
CREATININE: 1.15 mg/dL (ref 0.70–1.33)
Calcium: 9.4 mg/dL (ref 8.6–10.3)
Chloride: 104 mmol/L (ref 98–110)
Glucose, Bld: 136 mg/dL — ABNORMAL HIGH (ref 65–99)
Potassium: 4.7 mmol/L (ref 3.5–5.3)
Sodium: 139 mmol/L (ref 135–146)

## 2016-10-12 LAB — LIPID PANEL
CHOLESTEROL: 155 mg/dL (ref <200)
HDL: 32 mg/dL — AB (ref >40)
LDL Cholesterol: 72 mg/dL (ref <100)
TRIGLYCERIDES: 254 mg/dL — AB (ref <150)
Total CHOL/HDL Ratio: 4.8 Ratio (ref <5.0)
VLDL: 51 mg/dL — ABNORMAL HIGH (ref <30)

## 2016-10-13 ENCOUNTER — Inpatient Hospital Stay (HOSPITAL_COMMUNITY): Admission: RE | Admit: 2016-10-13 | Payer: Managed Care, Other (non HMO) | Source: Ambulatory Visit

## 2016-10-13 ENCOUNTER — Telehealth: Payer: Self-pay | Admitting: Cardiology

## 2016-10-13 LAB — HEMOGLOBIN A1C
Hgb A1c MFr Bld: 6.8 % — ABNORMAL HIGH (ref <5.7)
Mean Plasma Glucose: 148 mg/dL

## 2016-10-13 NOTE — Telephone Encounter (Signed)
Notes recorded by SwazilandJordan, Peter M, MD on 10/13/2016 at 7:07 AM EDT A1c is increased 5.7>>6.8%. Renal indices are better. Other chemistries are normal. Cholesterol is good but triglycerides are higher possibly reflecting higher blood sugar. He should follow up with primary care concerning blood sugar.  Pt notified he will call PCP and discuss this. Results sent to PCP via Epic

## 2016-10-13 NOTE — Telephone Encounter (Signed)
New Message    Pt is calling back to get lab results

## 2016-10-16 ENCOUNTER — Encounter (HOSPITAL_COMMUNITY): Payer: Managed Care, Other (non HMO)

## 2016-10-23 ENCOUNTER — Ambulatory Visit (HOSPITAL_COMMUNITY)
Admission: RE | Admit: 2016-10-23 | Discharge: 2016-10-23 | Disposition: A | Discharge status: Home / Self Care | Payer: Managed Care, Other (non HMO) | Source: Ambulatory Visit | Attending: Cardiology | Admitting: Cardiology

## 2016-10-23 DIAGNOSIS — R0989 Other specified symptoms and signs involving the circulatory and respiratory systems: Secondary | ICD-10-CM | POA: Diagnosis not present

## 2016-10-23 DIAGNOSIS — I739 Peripheral vascular disease, unspecified: Secondary | ICD-10-CM

## 2016-10-23 DIAGNOSIS — I779 Disorder of arteries and arterioles, unspecified: Secondary | ICD-10-CM | POA: Diagnosis not present

## 2016-10-23 DIAGNOSIS — I6523 Occlusion and stenosis of bilateral carotid arteries: Secondary | ICD-10-CM | POA: Insufficient documentation

## 2017-09-27 ENCOUNTER — Other Ambulatory Visit: Payer: Self-pay | Admitting: Cardiology

## 2017-10-28 ENCOUNTER — Other Ambulatory Visit: Payer: Self-pay | Admitting: Cardiology

## 2017-10-29 ENCOUNTER — Other Ambulatory Visit: Payer: Self-pay

## 2017-11-26 ENCOUNTER — Other Ambulatory Visit: Payer: Self-pay | Admitting: Cardiology

## 2017-12-01 ENCOUNTER — Other Ambulatory Visit: Payer: Self-pay | Admitting: Cardiology

## 2017-12-21 ENCOUNTER — Other Ambulatory Visit: Payer: Self-pay | Admitting: Cardiology

## 2017-12-21 NOTE — Telephone Encounter (Signed)
Rx request sent to pharmacy.  

## 2017-12-31 ENCOUNTER — Other Ambulatory Visit: Payer: Self-pay | Admitting: Cardiology

## 2017-12-31 NOTE — Telephone Encounter (Signed)
Rx sent to pharmacy   

## 2018-01-17 ENCOUNTER — Other Ambulatory Visit: Payer: Self-pay | Admitting: Cardiology

## 2018-01-25 ENCOUNTER — Other Ambulatory Visit: Payer: Self-pay | Admitting: Cardiology

## 2018-02-21 ENCOUNTER — Other Ambulatory Visit: Payer: Self-pay | Admitting: Cardiology

## 2018-03-10 ENCOUNTER — Other Ambulatory Visit: Payer: Self-pay | Admitting: Cardiology

## 2018-09-06 ENCOUNTER — Other Ambulatory Visit: Payer: Self-pay | Admitting: Cardiology

## 2018-09-06 MED ORDER — CLOPIDOGREL BISULFATE 75 MG PO TABS: 75.0000 mg | ORAL_TABLET | Freq: Every day | ORAL | 0 refills | Status: DC

## 2018-09-06 MED ORDER — ATORVASTATIN CALCIUM 80 MG PO TABS
80.0000 mg | ORAL_TABLET | Freq: Every day | ORAL | 0 refills | Status: DC
Start: 2018-09-06 — End: 2018-09-30

## 2018-09-06 NOTE — Telephone Encounter (Signed)
New Message    *STAT* If patient is at the pharmacy, call can be transferred to refill team.   1. Which medications need to be refilled? (please list name of each medication and dose if known) Atorvastatin, Clopidogrel  2. Which pharmacy/location (including street and city if local pharmacy) is [facility] to [facility] sent to[facility] [place]   3. Do they need a 30 day or 90 day supply? 90

## 2018-09-06 NOTE — Telephone Encounter (Signed)
Atorvastatin and clopidogrel refilled. 

## 2018-09-11 NOTE — Progress Notes (Deleted)
{Choose 1 Note Type (Telehealth Visit or Telephone Visit):[phone]}   Evaluation Performed:  Follow-up visit  Date:  09/11/2018   ID:  Roy Douglas, DOB Aug 12, 1960, MRN [id number]  Patient Location: Home Provider Location: Home  PCP:  Marinda Elk, MD  Cardiologist:   [place] MD Electrophysiologist:  None   Chief Complaint:  Follow up CAD  History of Present Illness:    Roy Douglas is a 58 y.o. male with a history of CAD. Last seen in May 2018. He has a history of acute inferior ST elevation MI on 08/28/2012. He underwent emergent stenting of the right coronary with drug-eluting stents. The segment of disease in the right coronary was fairly long and it required 2 stents to adequately cover. He otherwise had nonobstructive disease. Left ventricular function was mildly reduced by cath at 45-50%. By subsequent echocardiogram it was 50-55%. Follow up ETT in Sept. 2016 was normal.   He also has carotid arterial disease with doppler in June 2018 showing 40-59% bilateral ICA stenosis.  He has a bruit.  He has a history of hyperlipidemia.   The patient {does/does not:200015} have symptoms concerning for COVID-19 infection (fever, chills, cough, or new shortness of breath).    Past Medical History:  Diagnosis Date  . CAD (coronary artery disease)   . GERD (gastroesophageal reflux disease)   . Gout   . Hyperlipidemia   . Subsequent ST elevation (STEMI) myocardial infarction of inferior wall (HCC)    stent RCA DES 08/28/12   Past Surgical History:  Procedure Laterality Date  . ANKLE SURGERY    . KNEE SURGERY    . LEFT HEART CATHETERIZATION WITH CORONARY ANGIOGRAM N/A 08/28/2012   Procedure: LEFT HEART CATHETERIZATION WITH CORONARY ANGIOGRAM;  Surgeon:  M Swaziland, MD;  Location: [facility] C[facility] LAB;  Service: Cardiovascular;  Laterality: N/A;     No outpatient medications have been marked as taking for the 09/16/18 encounter (Appointment) with [place],  M, MD.     Allergies:    Keflex [cephalexin]   Social History   Tobacco Use  . Smoking status: Former Smoker    Types: Cigarettes    Last attempt to quit: 07/30/2011    Years since quitting: 7.1  . Smokeless tobacco: Never Used  Substance Use Topics  . Alcohol use: No  . Drug use: No     Family Hx: The patient's family history includes Hyperlipidemia in his brother and sister; Peripheral vascular disease in his mother.  ROS:   Please see the history of present illness.    *** All other systems reviewed and are negative.   Prior CV studies:   The following studies were reviewed today:  ***  Labs/Other Tests and Data Reviewed:    EKG:  {EKG/Telemetry Strips Reviewed:[phone]}  Recent Labs: No results found for requested labs within last 8760 hours.   Recent Lipid Panel Lab Results  Component Value Date/Time   CHOL 155 10/12/2016 08:41 AM   TRIG 254 (H) 10/12/2016 08:41 AM   HDL 32 (L) 10/12/2016 08:41 AM   CHOLHDL 4.8 10/12/2016 08:41 AM   LDLCALC 72 10/12/2016 08:41 AM    Wt Readings from Last 3 Encounters:  09/26/16 246 lb (111.6 kg)  01/15/15 235 lb 14.4 oz (107 kg)  10/02/13 229 lb (103.9 kg)     Objective:    Vital Signs:  There were no vitals taken for this visit.   {[facility] Virtual Exam (Optional):[phone]::"VITAL SIGNS:  reviewed"}  ASSESSMENT & PLAN:    1. Coronary  disease status post inferior STEMI status post extensive stenting of the right coronary with drug-eluting stents in April 2014.  Given the length of stented artery and presentation with acute MI I have recommended continued DAPT with ASA and Plavix. Continue  statin therapy. Given his ED I think we can stop the metoprolol now. Monitor BP at home. Encourage more aerobic activity.   2. Dyslipidemia. On Lipitor. Will check fasting lab work today.  3. Carotid arterial disease with Left carotid bruit. Prior dopplers with 50-79% stenosis. Will update carotid dopplers now.   COVID-19 Education: The signs  and symptoms of COVID-19 were discussed with the patient and how to seek care for testing (follow up with PCP or arrange E-visit).  ***The importance of social distancing was discussed today.  Time:   Today, I have spent *** minutes with the patient with telehealth technology discussing the above problems.     Medication Adjustments/Labs and Tests Ordered: Current medicines are reviewed at length with the patient today.  Concerns regarding medicines are outlined above.   Tests Ordered: No orders of the defined types were placed in this encounter.   Medication Changes: No orders of the defined types were placed in this encounter.   Disposition:  Follow up {follow up:15908}  Signed,  [place]iland[place], MD  09/11/2018 10:44 AM     [facility]

## 2018-09-13 ENCOUNTER — Telehealth: Payer: Self-pay | Admitting: Cardiology

## 2018-09-13 NOTE — Telephone Encounter (Signed)
smartphone [phone] my chart via text/ consent/ pre reg completed

## 2018-09-16 ENCOUNTER — Telehealth: Payer: Managed Care, Other (non HMO) | Admitting: Cardiology

## 2018-09-29 ENCOUNTER — Other Ambulatory Visit: Payer: Self-pay | Admitting: Cardiology

## 2018-10-08 ENCOUNTER — Telehealth: Payer: Self-pay | Admitting: Cardiology

## 2018-10-08 NOTE — Telephone Encounter (Signed)
LVM for pt to call regarding video or phone visit on 10-18-18. ST

## 2018-10-15 NOTE — Progress Notes (Signed)
Virtual Visit via Video Note   This visit type was conducted due to national recommendations for restrictions regarding the COVID-19 Pandemic (e.g. social distancing) in an effort to limit this patient's exposure and mitigate transmission in our community.  Due to his co-morbid illnesses, this patient is at least at moderate risk for complications without adequate follow up.  This format is felt to be most appropriate for this patient at this time.  All issues noted in this document were discussed and addressed.  A limited physical exam was performed with this format.  Please refer to the patient's chart for his consent to telehealth for [facility].   Date:  10/18/2018   ID:  Roy Douglas, DOB 12/20/1960, MRN [id number]  Patient Location: Home Provider Location: Home  PCP:  Fatima SangerMeyer, Stephen, MD  Cardiologist:  Willadeen Colantuono SwazilandJordan MD Electrophysiologist:  None   Evaluation Performed:  Follow-Up Visit  Chief Complaint:  Follow up CAD  History of Present Illness:    Roy Munroddie Cryder is a 58 y.o. male with  a history of acute inferior ST elevation MI on 08/28/2012. He underwent emergent stenting of the right coronary with drug-eluting stents. The segment of disease in the right coronary was fairly long and it required 2 stents to adequately cover. He otherwise had nonobstructive disease. Left ventricular function was mildly reduced by cath at 45-50%. By subsequent echocardiogram it was 50-55%. Follow up ETT in Sept. 2016 was normal.   He also has carotid arterial disease with doppler in Sept. 2016 showing a 50-79% left ICA stenosis. He has a bruit.  He has a history of hyperlipidemia.  On follow up today he states he is feeling excellent. Denies any chest pain or SOB. He is very active exercising regularly. Has lost 20 lbs since the beginning of the year. Had recent lab work with Dr Daphane ShepherdMeyer. Reports sugar is a little high. Does not recall cholesterol levels.   The patient does not have symptoms  concerning for COVID-19 infection (fever, chills, cough, or new shortness of breath).    Past Medical History:  Diagnosis Date  . CAD (coronary artery disease)   . GERD (gastroesophageal reflux disease)   . Gout   . Hyperlipidemia   . Subsequent ST elevation (STEMI) myocardial infarction of inferior wall (HCC)    stent RCA DES 08/28/12   Past Surgical History:  Procedure Laterality Date  . ANKLE SURGERY    . KNEE SURGERY    . LEFT HEART CATHETERIZATION WITH CORONARY ANGIOGRAM N/A 08/28/2012   Procedure: LEFT HEART CATHETERIZATION WITH CORONARY ANGIOGRAM;  Surgeon: Dalal Livengood M SwazilandJordan, MD;  [facility];  Service: Cardiovascular;  Laterality: N/A;     Current Meds  Medication Sig  . aspirin 81 MG tablet Take 1 tablet (81 mg total) by mouth daily.  Marland Kitchen. atorvastatin (LIPITOR) 80 MG tablet TAKE 1 TABLET (80 MG TOTAL) BY MOUTH DAILY AT 6 PM. NEED OV.  . clopidogrel (PLAVIX) 75 MG tablet TAKE 1 TABLET (75 MG TOTAL) BY MOUTH DAILY. NEED OV.  Marland Kitchen. Cyanocobalamin (VITAMIN B 12 PO) Take by mouth daily.  . Multiple Vitamins-Minerals (MULTIVITAMIN WITH MINERALS) tablet Take 1 tablet by mouth daily.  . nitroGLYCERIN (NITROSTAT) 0.4 MG SL tablet Place 1 tablet (0.4 mg total) under the tongue every 5 (five) minutes as needed for chest pain.  . Omega-3 Fatty Acids (FISH OIL CONCENTRATE PO) Take 1 capsule by mouth daily.     Allergies:   Keflex [cephalexin]   Social History  Tobacco Use  . Smoking status: Former Smoker    Types: Cigarettes    Last attempt to quit: 07/30/2011    Years since quitting: 7.2  . Smokeless tobacco: Never Used  Substance Use Topics  . Alcohol use: No  . Drug use: No     Family Hx: The patient's family history includes Hyperlipidemia in his brother and sister; Peripheral vascular disease in his mother.  ROS:   Please see the history of present illness.    All other systems reviewed and are negative.   Prior CV studies:   The following studies were reviewed  today:  none  Labs/Other Tests and Data Reviewed:    EKG:  No ECG reviewed.  Recent Labs: No results found for requested labs within last 8760 hours.   Recent Lipid Panel Lab Results  Component Value Date/Time   CHOL 155 10/12/2016 08:41 AM   TRIG 254 (H) 10/12/2016 08:41 AM   HDL 32 (L) 10/12/2016 08:41 AM   CHOLHDL 4.8 10/12/2016 08:41 AM   LDLCALC 72 10/12/2016 08:41 AM    Wt Readings from Last 3 Encounters:  10/18/18 212 lb (96.2 kg)  09/26/16 246 lb (111.6 kg)  01/15/15 235 lb 14.4 oz (107 kg)     Objective:    Vital Signs:  BP 125/83   Pulse 77   Ht 5\' 10"  (1.778 m)   Wt 212 lb (96.2 kg)   BMI 30.42 kg/m    VITAL SIGNS:  reviewed  General no distress HEENT EOMI. Sclera clear Respiration unlabored. Neuro alert and oriented x 3. nonfocal Mood normal  ASSESSMENT & PLAN:    1. Coronary disease status post inferior STEMI status post extensive stenting of the right coronary with drug-eluting stents in April 2014.  Given the length of stented artery and presentation with acute MI I have recommended continued DAPT with ASA and Plavix unless he were to have bleeding issues.  Continue  statin therapy.   2. Dyslipidemia. On Lipitor. Will request a copy of recent lab work  3. Carotid arterial disease with Left carotid bruit. Prior dopplers with 50-79% stenosis bilateral in May 2018. Will update carotid dopplers now.    COVID-19 Education: The signs and symptoms of COVID-19 were discussed with the patient and how to seek care for testing (follow up with PCP or arrange E-visit).  The importance of social distancing was discussed today.  Time:   Today, I have spent 10 minutes with the patient with telehealth technology discussing the above problems.     Medication Adjustments/Labs and Tests Ordered: Current medicines are reviewed at length with the patient today.  Concerns regarding medicines are outlined above.   Tests Ordered: No orders of the defined types  were placed in this encounter.   Medication Changes: No orders of the defined types were placed in this encounter.   Disposition:  Follow up in 1 year(s)  Signed, Sharmila Wrobleski [place], MD  10/18/2018 10:15 AM    [facility]

## 2018-10-16 ENCOUNTER — Telehealth: Payer: Self-pay | Admitting: Cardiology

## 2018-10-17 NOTE — Telephone Encounter (Signed)
smartphone/ my chart active/ insurance updated/ consent/ pre reg completed

## 2018-10-18 ENCOUNTER — Encounter: Payer: Self-pay | Admitting: Cardiology

## 2018-10-18 ENCOUNTER — Telehealth (INDEPENDENT_AMBULATORY_CARE_PROVIDER_SITE_OTHER): Payer: 59 | Admitting: Cardiology

## 2018-10-18 VITALS — BP 125/83 | HR 77 | Ht 70.0 in | Wt 212.0 lb

## 2018-10-18 DIAGNOSIS — I6523 Occlusion and stenosis of bilateral carotid arteries: Secondary | ICD-10-CM

## 2018-10-18 DIAGNOSIS — E782 Mixed hyperlipidemia: Secondary | ICD-10-CM

## 2018-10-18 DIAGNOSIS — I2510 Atherosclerotic heart disease of native coronary artery without angina pectoris: Secondary | ICD-10-CM | POA: Diagnosis not present

## 2018-10-18 NOTE — Patient Instructions (Signed)
Medication Instructions:  Continue same medications If you need a refill on your cardiac medications before your next appointment, please call your pharmacy.   Lab work None ordered  Labs to be requested with Dr.Myers  Testing/Procedures: Scheduler will call to schedule carotid dopplers  Follow-Up: At [facility], you and your health needs are our priority.  As part of our continuing mission to provide you with exceptional heart care, we have created designated Provider Care Teams.  These Care Teams include your primary Cardiologist (physician) and Advanced Practice Providers (APPs -  Physician Assistants and Nurse Practitioners) who all work together to provide you with the care you need, when you need it. . Schedule follow up appointment in 1 year    Call 3 months before to schedule

## 2018-10-18 NOTE — Addendum Note (Signed)
Addended by: Neoma Laming on: 10/18/2018 10:29 AM   Modules accepted: Orders

## 2018-10-29 ENCOUNTER — Other Ambulatory Visit: Payer: Self-pay | Admitting: Cardiology

## 2018-11-11 ENCOUNTER — Ambulatory Visit (HOSPITAL_COMMUNITY)
Admission: RE | Admit: 2018-11-11 | Discharge: 2018-11-11 | Disposition: A | Discharge status: Home / Self Care | Payer: 59 | Source: Ambulatory Visit | Attending: Internal Medicine | Admitting: Internal Medicine

## 2018-11-11 ENCOUNTER — Other Ambulatory Visit: Payer: Self-pay

## 2018-11-11 DIAGNOSIS — I2510 Atherosclerotic heart disease of native coronary artery without angina pectoris: Secondary | ICD-10-CM | POA: Insufficient documentation

## 2018-11-11 DIAGNOSIS — I6523 Occlusion and stenosis of bilateral carotid arteries: Secondary | ICD-10-CM | POA: Insufficient documentation

## 2018-11-11 DIAGNOSIS — E782 Mixed hyperlipidemia: Secondary | ICD-10-CM | POA: Diagnosis present

## 2018-11-12 ENCOUNTER — Telehealth: Payer: Self-pay | Admitting: *Deleted

## 2018-11-12 DIAGNOSIS — I6523 Occlusion and stenosis of bilateral carotid arteries: Secondary | ICD-10-CM

## 2018-11-12 DIAGNOSIS — I6522 Occlusion and stenosis of left carotid artery: Secondary | ICD-10-CM

## 2018-11-12 NOTE — Telephone Encounter (Signed)
Patient returned your call.

## 2018-11-12 NOTE — Telephone Encounter (Addendum)
Left message for pt to call   ----- Message from Peter M Martinique, MD sent at 11/12/2018  9:52 AM EDT ----- [place]  This study demonstrates:  SINGNIFICANT STENOSIS IN LEFT CAROTID BULB Medication changes / Follow up studies / Other recommendations:   NEEDS VASCULAR SURGERY REFERRAL.  Please send results to the PCP:  Blanchie Serve, MD  Peter Martinique, MD 11/12/2018 9:51 AM

## 2018-11-12 NOTE — Telephone Encounter (Signed)
Spoke with pt, aware of results and dr Doug Sou recommendations. Referral placed for [facility].

## 2018-11-18 ENCOUNTER — Telehealth: Payer: Self-pay | Admitting: Cardiology

## 2018-11-18 NOTE — Telephone Encounter (Signed)
Called [facility] at [phone] and spoke with Alyse Low who has received the referral and is waiting for the nurse to review the notes.

## 2018-11-25 ENCOUNTER — Telehealth: Payer: Self-pay | Admitting: Cardiology

## 2018-11-25 NOTE — Telephone Encounter (Signed)
Called [facility] at [phone] to inquire about referral for patient.  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

## 2018-12-16 ENCOUNTER — Telehealth (HOSPITAL_COMMUNITY): Payer: Self-pay | Admitting: Rehabilitation

## 2018-12-16 NOTE — Telephone Encounter (Signed)

## 2018-12-17 ENCOUNTER — Ambulatory Visit (INDEPENDENT_AMBULATORY_CARE_PROVIDER_SITE_OTHER): Payer: 59 | Admitting: Vascular Surgery

## 2018-12-17 ENCOUNTER — Other Ambulatory Visit: Payer: Self-pay

## 2018-12-17 VITALS — BP 145/93 | HR 59 | Temp 98.0°F | Resp 14 | Ht 69.0 in | Wt 223.0 lb

## 2018-12-17 DIAGNOSIS — I6523 Occlusion and stenosis of bilateral carotid arteries: Secondary | ICD-10-CM | POA: Diagnosis not present

## 2018-12-17 DIAGNOSIS — R0989 Other specified symptoms and signs involving the circulatory and respiratory systems: Secondary | ICD-10-CM | POA: Diagnosis not present

## 2018-12-17 DIAGNOSIS — I6529 Occlusion and stenosis of unspecified carotid artery: Secondary | ICD-10-CM | POA: Insufficient documentation

## 2018-12-17 NOTE — Progress Notes (Signed)
Patient name: Roy Douglas MRN: [id number] DOB: 01/19/1961 Sex: male  REASON FOR CONSULT: Evaluate carotid stenosis  HPI: Roy Douglas is a 58 y.o. male, with history of coronary artery disease status post STEMI requiring coronary stent and hyperlipidemia that presents for evaluation of carotid disease.  Patient denies any previous history of stroke or TIA.  He denies any vision loss or weakness in arm or leg.  He states his carotid disease was initially evaluated by his cardiologist has been followed for some time.  His cardiologist is Dr. SwazilandJordan with [facility].  He denies any tobacco abuse.  He states he has been losing weight after adjusting his diet.  He is on plavix for his coronary stent.  Takes a statin as well.  Past Medical History:  Diagnosis Date  . CAD (coronary artery disease)   . GERD (gastroesophageal reflux disease)   . Gout   . Hyperlipidemia   . Subsequent ST elevation (STEMI) myocardial infarction of inferior wall (HCC)    stent RCA DES 08/28/12    Past Surgical History:  Procedure Laterality Date  . ANKLE SURGERY    . KNEE SURGERY    . LEFT HEART CATHETERIZATION WITH CORONARY ANGIOGRAM N/A 08/28/2012   Procedure: LEFT HEART CATHETERIZATION WITH CORONARY ANGIOGRAM;  Surgeon: Peter M SwazilandJordan, MD;  [facility];  Service: Cardiovascular;  Laterality: N/A;    Family History  Problem Relation Age of Onset  . Peripheral vascular disease Mother   . Hyperlipidemia Sister   . Hyperlipidemia Brother     SOCIAL HISTORY: Social History   Socioeconomic History  . Marital status: Married    Spouse name: Not on file  . Number of children: Not on file  . Years of education: Not on file  . Highest education level: Not on file  Occupational History  . Not on file  Social Needs  . Financial resource strain: Not on file  . Food insecurity    Worry: Not on file    Inability: Not on file  . Transportation needs    Medical: Not on file    Non-medical: Not  on file  Tobacco Use  . Smoking status: Former Smoker    Types: Cigarettes    Quit date: 07/30/2011    Years since quitting: 7.3  . Smokeless tobacco: Never Used  Substance and Sexual Activity  . Alcohol use: No  . Drug use: No  . Sexual activity: Yes  Lifestyle  . Physical activity    Days per week: Not on file    Minutes per session: Not on file  . Stress: Not on file  Relationships  . Social Musicianconnections    Talks on phone: Not on file    Gets together: Not on file    Attends religious service: Not on file    Active member of club or organization: Not on file    Attends meetings of clubs or organizations: Not on file    Relationship status: Not on file  . Intimate partner violence    Fear of current or ex partner: Not on file    Emotionally abused: Not on file    Physically abused: Not on file    Forced sexual activity: Not on file  Other Topics Concern  . Not on file  Social History Narrative  . Not on file    Allergies  Allergen Reactions  . Keflex [Cephalexin]     Mental changes    Current Outpatient Medications  Medication Sig Dispense Refill  . aspirin 81 MG tablet Take 1 tablet (81 mg total) by mouth daily. 30 tablet   . atorvastatin (LIPITOR) 80 MG tablet TAKE 1 TABLET (80 MG TOTAL) BY MOUTH DAILY AT 6 PM. NEED OV. 30 tablet 11  . clopidogrel (PLAVIX) 75 MG tablet TAKE 1 TABLET (75 MG TOTAL) BY MOUTH DAILY. NEED OV. 30 tablet 11  . Cyanocobalamin (VITAMIN B 12 PO) Take by mouth daily.    . Multiple Vitamins-Minerals (MULTIVITAMIN WITH MINERALS) tablet Take 1 tablet by mouth daily.    . nitroGLYCERIN (NITROSTAT) 0.4 MG SL tablet Place 1 tablet (0.4 mg total) under the tongue every 5 (five) minutes as needed for chest pain. 25 tablet 3  . Omega-3 Fatty Acids (FISH OIL CONCENTRATE PO) Take 1 capsule by mouth daily.     No current facility-administered medications for this visit.     REVIEW OF SYSTEMS:  [X]  denotes positive finding, [ ]  denotes negative  finding Cardiac  Comments:  Chest pain or chest pressure:    Shortness of breath upon exertion:    Short of breath when lying flat:    Irregular heart rhythm:        Vascular    Pain in calf, thigh, or hip brought on by ambulation:    Pain in feet at night that wakes you up from your sleep:     Blood clot in your veins:    Leg swelling:         Pulmonary    Oxygen at home:    Productive cough:     Wheezing:         Neurologic    Sudden weakness in arms or legs:     Sudden numbness in arms or legs:     Sudden onset of difficulty speaking or slurred speech:    Temporary loss of vision in one eye:     Problems with dizziness:         Gastrointestinal    Blood in stool:     Vomited blood:         Genitourinary    Burning when urinating:     Blood in urine:        Psychiatric    Major depression:         Hematologic    Bleeding problems:    Problems with blood clotting too easily:        Skin    Rashes or ulcers:        Constitutional    Fever or chills:      PHYSICAL EXAM: Vitals:   12/17/18 1106  BP: (!) 145/93  Pulse: (!) 59  Resp: 14  Temp: 98 F (36.7 C)  TempSrc: Temporal  SpO2: 100%  Weight: 223 lb (101.2 kg)  Height: 5\' 9"  (1.753 m)    GENERAL: The patient is a well-nourished male, in no acute distress. The vital signs are documented above. CARDIAC: There is a regular rate and rhythm.  VASCULAR:  2+ radial pulse palpable bilaterally 2+ femoral pulse palpable bilatearlly PULMONARY: There is good air exchange bilaterally without wheezing or rales. ABDOMEN: Soft and non-tender with normal pitched bowel sounds.  MUSCULOSKELETAL: There are no major deformities or cyanosis. NEUROLOGIC: No focal weakness or paresthesias are detected.  CN II-XII grossly intact. SKIN: There are no ulcers or rashes noted. PSYCHIATRIC: The patient has a normal affect.  DATA:   I reviewed his carotid duplex which shows very elevated velocity in  the distal left common  carotid of 364/130.  His left ICA velocities 189/51 suggesting a 40 to 59% stenosis but these velocities may be underestimated.  Right ICA velocities 138/55 suggesting a 40 to 59% stenosis.  Assessment/Plan:  57-year male that presents with asymptomatic carotid disease.  Based on his ICA velocities it would suggest his stenosis would be in the 40 to 59% range.  However on the left he has a very elevated velocity in the distal common carotid of 364/130 and this may underestimate the velocities in his left ICA.  As result I have recommended a CTA neck to further evaluate.  I will contact him with the results.  He can continue the statin as well as Plavix for cardiovascular risk reduction in the meantime.  I will contact him with the CTA results and we will make plans for follow-up in 6 months with carotid duplex unless he need surgery based on a CT. Discussed surgery is reserved for >80% stenosis in asymptomatic disease.   Cephus Shellinghristopher J. Anay Walter, MD [facility] [place] Office: [phone] Pager: [phone]

## 2018-12-18 ENCOUNTER — Telehealth: Payer: Self-pay | Admitting: Vascular Surgery

## 2018-12-18 ENCOUNTER — Inpatient Hospital Stay: Admission: RE | Admit: 2018-12-18 | Payer: 59 | Source: Ambulatory Visit

## 2018-12-18 NOTE — Telephone Encounter (Signed)
-----   Message from Marty Heck, MD sent at 12/18/2018 12:14 PM EDT ----- Regarding: RE: CTA Neck Thanks. She needs to look at distal common carotid velocity of 300/130 which is way high and likely critical stenosis and duplex underestimates ica velocity from common carotid disease just proximal. ----- Message ----- From: Kaleen Mask, LPN Sent: 09/13/7780  42:35 PM EDT To: Marty Heck, MD Subject: RE: CTA Neck                                   Hey Dr. Carlis Abbott.  There is new information.  I spoke to Onalee Hua., RN with [facility].  This was put in as a CTA abd/Pel but was supposed to be CTA Neck.  I changed it but the nurse still thinks it will be denied since the notes say that he is asymptomatic and surgery is reserved for >80% stenosis in asymptomatic disease.  I will contact you again with their decision.  Thanks for your help!  Thurston Hole., LPN  ----- Message ----- From: Marty Heck, MD Sent: 12/18/2018  11:41 AM EDT To: Kaleen Mask, LPN Subject: RE: CTA Neck                                   Now. ----- Message ----- From: Kaleen Mask, LPN Sent: 07/18/1441  15:40 AM EDT To: Marty Heck, MD Subject: CTA Neck                                       Hello again.  [facility] is requesting a Peer to Peer Review for a CTA of the neck.  What are good times to schedule them to call you?  Thank you,  Thurston Hole., LPN

## 2018-12-24 ENCOUNTER — Other Ambulatory Visit: Payer: Self-pay

## 2018-12-24 ENCOUNTER — Ambulatory Visit
Admission: RE | Admit: 2018-12-24 | Discharge: 2018-12-24 | Disposition: A | Discharge status: Home / Self Care | Payer: 59 | Source: Ambulatory Visit | Attending: Vascular Surgery | Admitting: Vascular Surgery

## 2018-12-24 DIAGNOSIS — R0989 Other specified symptoms and signs involving the circulatory and respiratory systems: Secondary | ICD-10-CM

## 2018-12-24 MED ORDER — IOPAMIDOL (ISOVUE-370) INJECTION 76%
75.0000 mL | Freq: Once | INTRAVENOUS | Status: AC | PRN
  Administered 2018-12-24: 75 mL via INTRAVENOUS

## 2019-01-01 ENCOUNTER — Telehealth: Payer: Self-pay | Admitting: Vascular Surgery

## 2019-01-01 NOTE — Telephone Encounter (Signed)
58 year old male that was recently seen in clinic for carotid stenosis after referral from Dr. Martinique with cardiology.  CTA was obtained given some common carotid disease that made velocities somewhat inaccurate in the ICA.  On my review he does appear to have a greater than 80% high-grade stenosis of his right ICA.  I called and discussed the CTA results with him.  I have recommended right carotid endarterectomy for long-term stroke risk reduction.  Risk and benefits were discussed in detail.  He has just started a new job and is waiting on his new insurance to kick in.  He will contact our office and schedule when it is convenient.  Marty Heck, MD [facility] [place] Office: [phone] Pager: [phone]

## 2019-05-14 ENCOUNTER — Other Ambulatory Visit: Payer: Self-pay

## 2019-05-15 ENCOUNTER — Telehealth: Payer: Self-pay

## 2019-05-15 NOTE — Telephone Encounter (Signed)
   [facility] Pre-operative Risk Assessment    Request for surgical clearance:  1. What type of surgery is being performed? Right Carotid Endarterectomy   2. When is this surgery scheduled? 06/12/19  3. What type of clearance is required (medical clearance vs. Pharmacy clearance to hold med vs. Both)? Both  4. Are there any medications that need to be held prior to surgery and how long? Plavix and Aspirin  5. Practice name and name of physician performing surgery? [facility]   6. What is your office phone number [phone]   7.   What is your office fax number [phone]  8.   Anesthesia type  Not listed   Kathyrn Lass 05/15/2019, 9:20 AM  _________________________________________________________________   (provider comments below)

## 2019-05-15 NOTE — Telephone Encounter (Signed)
Left message for pt to call Dr. Doug Sou office to schedule an appt for his pre op clearance . Pt will need to have in person appt so that we can do an EKG as well.

## 2019-05-15 NOTE — Telephone Encounter (Signed)
   Primary Cardiologist:Peter Martinique, MD  Chart reviewed as part of pre-operative protocol coverage. Because of Roy Douglas's past medical history and time since last visit, he/she will require a follow-up visit in order to better assess preoperative cardiovascular risk.  Pre-op covering staff: - Please schedule appointment and call patient to inform them. (Please put 'EKG' in appt notes as his last EKG was 2017) - Please contact requesting surgeon's office via preferred method (i.e, phone, fax) to inform them of need for appointment prior to surgery.  This message will be routed to Dr. Martinique for input on holding antiplatelet agents so that this information is available at time of patient's appointment.   Loel Dubonnet, NP  05/15/2019, 10:10 AM

## 2019-05-15 NOTE — Telephone Encounter (Signed)
Agree. If cleared for surgery may hold Plavix for 5 days prior.  Peter Martinique MD, [facility]

## 2019-05-15 NOTE — Telephone Encounter (Signed)
   Primary Cardiologist:Peter Martinique, MD  Dr. Martinique,   Mr. Roy Douglas is 58 yo male with PMH CAD s/p inferior STEMI 08/2012 with DESx2 to rCA, carotid arterial disease. LVEF during cath was 45-50%, but subsequent echo 01/2015 normal. He had an ETT 01/2015 which was low risk.  Last seen via telemedicine by you 10/18/2018 and was doing well. Subsequent carotid duplex showed significant stenosis in L carotid bulb and he was referred to vascular surgery. He has been scheduled for R endarterectomy with Dr. Carlis Abbott 06/12/19.   I have asked office staff to have him scheduled for an appt as his last office visit was 6 months ago and his last EKG was 2017.   He has remained on DAPT aspirin, plavix due to length of stented artery and presentation with acute MI. Please provide recommendations regarding aspirin/plavix prior to procedure and route to 'p cv div preop'. Thank you!  Loel Dubonnet, NP  05/15/2019, 10:12 AM

## 2019-05-19 NOTE — Telephone Encounter (Signed)
Leave message for pt to give office a call back to schedule pre op appointment

## 2019-05-20 NOTE — Telephone Encounter (Signed)
Pt has appt Roy Douglas, [facility] 05/27/19 for pre op clearance. I will forward clearance notes to PA for upcoming appt. I will remove from the pre op call back pool.

## 2019-05-27 ENCOUNTER — Other Ambulatory Visit: Payer: Self-pay

## 2019-05-27 ENCOUNTER — Encounter: Payer: Self-pay | Admitting: Physician Assistant

## 2019-05-27 ENCOUNTER — Ambulatory Visit: Payer: 59 | Admitting: Physician Assistant

## 2019-05-27 VITALS — BP 149/88 | HR 85 | Temp 98.4°F | Ht 68.0 in | Wt 226.8 lb

## 2019-05-27 DIAGNOSIS — Z01818 Encounter for other preprocedural examination: Secondary | ICD-10-CM | POA: Diagnosis not present

## 2019-05-27 DIAGNOSIS — E785 Hyperlipidemia, unspecified: Secondary | ICD-10-CM | POA: Diagnosis not present

## 2019-05-27 DIAGNOSIS — I2510 Atherosclerotic heart disease of native coronary artery without angina pectoris: Secondary | ICD-10-CM | POA: Diagnosis not present

## 2019-05-27 DIAGNOSIS — I6523 Occlusion and stenosis of bilateral carotid arteries: Secondary | ICD-10-CM

## 2019-05-27 MED ORDER — NITROGLYCERIN 0.4 MG SL SUBL: 0.4000 mg | SUBLINGUAL_TABLET | SUBLINGUAL | 3 refills | Status: DC | PRN

## 2019-05-27 NOTE — Progress Notes (Signed)
Cardiology Office Note:    Date:  05/29/2019   ID:  Roy Douglas, DOB Dec 29, 1960, MRN [id number]  PCP:  Fatima Sanger, MD  Cardiologist:  Peter Swaziland, MD  Electrophysiologist:  None   Referring MD: Fatima Sanger, MD   Chief Complaint  Patient presents with  . Follow-up    seen for Dr. Swaziland.    History of Present Illness:    Roy Douglas is a 59 y.o. male with a hx of CAD, GERD, hyperlipidemia and carotid artery disease.  She had inferior STEMI in April 2014 and underwent emergent stenting of RCA with 2 drug-eluting stents.  Although initial ejection fraction was 45 to 50%.  Subsequent echocardiogram demonstrated EF was 50 to 55%.  Follow-up EGD in September 2016 was normal.  Patient had carotid Doppler in September 2016 that demonstrated moderate left ICA stenosis.  Patient was last seen by Dr. Swaziland as a virtual visit in June 2020 at which time he was doing well.  He has managed to lose 20 pounds at that time.  Repeat carotid Doppler obtained on 11/11/2018 showed moderate bilateral ICA stenosis unchanged from the previous year.  1 year repeat Doppler was recommended.  He was seen by Dr. Chestine Spore in August who felt Doppler may have underestimated the true carotid stenosis, therefore CT angiogram of the neck was obtained.  The study obtained on 12/24/2018 demonstrated bulk soft plaque at both carotid bifurcation resulting in hemodynamically significant stenosis, with high-grade stenosis in right ICA origin approaching radiographic string sign numerically estimated at 75%, left CCA proximal to the bifurcation has a 65% stenosis.   Patient is currently scheduled for right carotid enterectomy on 06/04/2019.  He presents today for preoperative clearance requested by vascular surgery.  Talking with the patient today, he is quite active at home.  He usually walks about 8000-9000 steps on a daily basis.  He can clearly accomplish 4 METS of activity without any issue.  His RCRI risk is class II 6.6%  risk of major cardiac event during perioperative phase.  He denies any lower extremity edema, orthopnea or PND.  He has not been taking aspirin, he will need to hold Plavix for 5 days prior to the surgery and restart as soon as possible afterward.  Otherwise he can follow-up in 1 year.  I have refilled his sublingual nitroglycerin.   Past Medical History:  Diagnosis Date  . CAD (coronary artery disease)   . GERD (gastroesophageal reflux disease)   . Gout   . Hyperlipidemia   . Subsequent ST elevation (STEMI) myocardial infarction of inferior wall (HCC)    stent RCA DES 08/28/12    Past Surgical History:  Procedure Laterality Date  . ANKLE SURGERY    . KNEE SURGERY    . LEFT HEART CATHETERIZATION WITH CORONARY ANGIOGRAM N/A 08/28/2012   Procedure: LEFT HEART CATHETERIZATION WITH CORONARY ANGIOGRAM;  Surgeon: Peter M Swaziland, MD;  Location: [facility] CATH LAB;  Service: Cardiovascular;  Laterality: N/A;    Current Medications: Current Meds  Medication Sig  . atorvastatin (LIPITOR) 80 MG tablet TAKE 1 TABLET (80 MG TOTAL) BY MOUTH DAILY AT 6 PM. NEED OV.  . clopidogrel (PLAVIX) 75 MG tablet TAKE 1 TABLET (75 MG TOTAL) BY MOUTH DAILY. NEED OV.  Marland Kitchen Cyanocobalamin (VITAMIN B 12 PO) Take by mouth daily.  . Multiple Vitamins-Minerals (MULTIVITAMIN WITH MINERALS) tablet Take 1 tablet by mouth daily.  . nitroGLYCERIN (NITROSTAT) 0.4 MG SL tablet Place 1 tablet (0.4 mg total) under the tongue every  5 (five) minutes as needed for chest pain.  . Omega-3 Fatty Acids (FISH OIL CONCENTRATE PO) Take 1 capsule by mouth daily.  . [DISCONTINUED] aspirin 81 MG tablet Take 1 tablet (81 mg total) by mouth daily.  . [DISCONTINUED] nitroGLYCERIN (NITROSTAT) 0.4 MG SL tablet Place 1 tablet (0.4 mg total) under the tongue every 5 (five) minutes as needed for chest pain.     Allergies:   Keflex [cephalexin]   Social History   Socioeconomic History  . Marital status: Married    Spouse name: Not on file  . Number of  children: Not on file  . Years of education: Not on file  . Highest education level: Not on file  Occupational History  . Not on file  Tobacco Use  . Smoking status: Former Smoker    Types: Cigarettes    Quit date: 07/30/2011    Years since quitting: 7.8  . Smokeless tobacco: Never Used  Substance and Sexual Activity  . Alcohol use: No  . Drug use: No  . Sexual activity: Yes  Other Topics Concern  . Not on file  Social History Narrative  . Not on file   Social Determinants of Health   Financial Resource Strain:   . Difficulty of Paying Living Expenses: Not on file  Food Insecurity:   . Worried About Programme researcher, broadcasting/film/video in the Last Year: Not on file  . Ran Out of Food in the Last Year: Not on file  Transportation Needs:   . Lack of Transportation (Medical): Not on file  . Lack of Transportation (Non-Medical): Not on file  Physical Activity:   . Days of Exercise per Week: Not on file  . Minutes of Exercise per Session: Not on file  Stress:   . Feeling of Stress : Not on file  Social Connections:   . Frequency of Communication with Friends and Family: Not on file  . Frequency of Social Gatherings with Friends and Family: Not on file  . Attends Religious Services: Not on file  . Active Member of Clubs or Organizations: Not on file  . Attends Banker Meetings: Not on file  . Marital Status: Not on file     Family History: The patient's family history includes Hyperlipidemia in his brother and sister; Peripheral vascular disease in his mother.  ROS:   Please see the history of present illness.     All other systems reviewed and are negative.  EKGs/Labs/Other Studies Reviewed:    The following studies were reviewed today:  Carotid artery disease 10/2018 Summary: Right Carotid: Velocities in the right ICA are consistent with a 40-59% stenosis.  Left Carotid: Velocities in the left ICA are consistent with a 40-59% stenosis.               Hemodynamically  significant plaque >50% visualized in the CCA. The  ECA appears >50% stenosed. Severe stenosis in the distal LCCA, extending to the ostial LICA.  Vertebrals:  Bilateral vertebral arteries demonstrate antegrade flow. Subclavians: Normal flow hemodynamics were seen in bilateral subclavian arteries.   EKG:  EKG is ordered today.  The ekg ordered today demonstrates normal sinus rhythm with PVCs.  Recent Labs: No results found for requested labs within last 8760 hours.  Recent Lipid Panel    Component Value Date/Time   CHOL 155 10/12/2016 0841   TRIG 254 (H) 10/12/2016 0841   HDL 32 (L) 10/12/2016 0841   CHOLHDL 4.8 10/12/2016 0841   VLDL 51 (H)  10/12/2016 0841   LDLCALC 72 10/12/2016 0841    Physical Exam:    VS:  BP (!) 149/88   Pulse 85   Temp 98.4 F (36.9 C)   Ht 5\' 8"  (1.727 m)   Wt 226 lb 12.8 oz (102.9 kg)   SpO2 99%   BMI 34.48 kg/m     Wt Readings from Last 3 Encounters:  05/27/19 226 lb 12.8 oz (102.9 kg)  12/17/18 223 lb (101.2 kg)  10/18/18 212 lb (96.2 kg)     GEN:  Well nourished, well developed in no acute distress HEENT: Normal NECK: No JVD; No carotid bruits LYMPHATICS: No lymphadenopathy CARDIAC: RRR, no murmurs, rubs, gallops RESPIRATORY:  Clear to auscultation without rales, wheezing or rhonchi  ABDOMEN: Soft, non-tender, non-distended MUSCULOSKELETAL:  No edema; No deformity  SKIN: Warm and dry NEUROLOGIC:  Alert and oriented x 3 PSYCHIATRIC:  Normal affect   ASSESSMENT:    1. Pre-procedural examination   2. Coronary artery disease involving native coronary artery of native heart without angina pectoris   3. Hyperlipidemia LDL goal <70   4. Bilateral carotid artery stenosis    PLAN:    In order of problems listed above:  1. Preoperative clearance: Patient has upcoming right carotid enterectomy by Dr. 12/18/18 of vascular surgery.  He is able to accomplish at least 4 METS activity without any issue.  He may proceed with vascular surgery  without further work-up.  His RCRI risk is class II 6.6% risk of major cardiac event during perioperative phase  2. CAD: Denies any recent chest pain.  He continued to walk about 8000-9000 steps on a single day without any issue.  He may hold Plavix for 5 days prior to the carotid enterectomy  3. Hyperlipidemia: He is on fish oil and Lipitor  4. Bilateral carotid artery disease: Followed by vascular surgery.  Pending upcoming right carotid enterectomy.   Medication Adjustments/Labs and Tests Ordered: Current medicines are reviewed at length with the patient today.  Concerns regarding medicines are outlined above.  Orders Placed This Encounter  Procedures  . EKG 12-Lead   Meds ordered this encounter  Medications  . nitroGLYCERIN (NITROSTAT) 0.4 MG SL tablet    Sig: Place 1 tablet (0.4 mg total) under the tongue every 5 (five) minutes as needed for chest pain.    Dispense:  25 tablet    Refill:  3    Patient Instructions  Medication Instructions:   HOLD PLAVIX 5 DAYS PRIOR TO SURGERY AND RESTART AS SOON AS POSSIBLE AFTER SURGERY AT SURGEON'S DISCRETION *If you need a refill on your cardiac medications before your next appointment, please call your pharmacy*  Lab Work:  NONE ordered at this time of appointment   If you have labs (blood work) drawn today and your tests are completely normal, you will receive your results only by: Chestine Spore MyChart Message (if you have MyChart) OR . A paper copy in the mail If you have any lab test that is abnormal or we need to change your treatment, we will call you to review the results.  Testing/Procedures:  NONE ordered at this time of appointment   Follow-Up: At [facility], you and your health needs are our priority.  As part of our continuing mission to provide you with exceptional heart care, we have created designated Provider Care Teams.  These Care Teams include your primary Cardiologist (physician) and Advanced Practice Providers (APPs -   Physician Assistants and Nurse Practitioners) who all work  together to provide you with the care you need, when you need it.  Your next appointment:   12 month(s)  The format for your next appointment:   In Person  Provider:   Peter Martinique, MD  Other Instructions      Signed, Almyra Deforest, [place]  05/29/2019 11:54 PM    [place]

## 2019-05-27 NOTE — Progress Notes (Signed)
   Primary Cardiologist: Peter Swaziland, MD  Mr. Roy Douglas (DOB 14-Jan-1961) was seen in the [facility] office today as part of preoperative clearance requested by vascular surgery.  He is able to ambulate on average 8000-9000 steps on a daily basis.  He can clearly accomplish at least 4 METS of activity without any issue. EKG obtained today did not showed significant ischemic changes. No further cardiac work-up is recommended at this time.  His RCRI risk is class III, 6.6% risk of major cardiac event during perioperative period.   Of note, he is not taking aspirin at home. I have instructed the patient to hold plavix for 5 days prior to the surgery and restart plavix as soon as possible after the surgery at the surgeon's discretion.  Please call with questions.  Azalee Course, [place]  [facility] 05/27/2019, 4:12 PM

## 2019-05-27 NOTE — Patient Instructions (Signed)
Medication Instructions:   HOLD PLAVIX 5 DAYS PRIOR TO SURGERY AND RESTART AS SOON AS POSSIBLE AFTER SURGERY AT SURGEON'S DISCRETION *If you need a refill on your cardiac medications before your next appointment, please call your pharmacy*  Lab Work:  NONE ordered at this time of appointment   If you have labs (blood work) drawn today and your tests are completely normal, you will receive your results only by: Marland Kitchen MyChart Message (if you have MyChart) OR . A paper copy in the mail If you have any lab test that is abnormal or we need to change your treatment, we will call you to review the results.  Testing/Procedures:  NONE ordered at this time of appointment   Follow-Up: At [facility], you and your health needs are our priority.  As part of our continuing mission to provide you with exceptional heart care, we have created designated Provider Care Teams.  These Care Teams include your primary Cardiologist (physician) and Advanced Practice Providers (APPs -  Physician Assistants and Nurse Practitioners) who all work together to provide you with the care you need, when you need it.  Your next appointment:   12 month(s)  The format for your next appointment:   In Person  Provider:   Peter Swaziland, MD  Other Instructions

## 2019-05-29 ENCOUNTER — Encounter: Payer: Self-pay | Admitting: Physician Assistant

## 2019-06-09 ENCOUNTER — Encounter (HOSPITAL_COMMUNITY): Payer: Self-pay

## 2019-06-09 NOTE — Pre-Procedure Instructions (Signed)
[id number] - [place], [place] - [address] [place] [address] Phone: [phone] Fax: [phone]      Your procedure is scheduled on Thursday January 28th.  Report to [facility] Main Entrance "A" at 5;30 A.M., and check in at the Admitting office.  Call this number if you have problems the morning of surgery:  [phone]  Call [phone] if you have any questions prior to your surgery date Monday-Friday 8am-4pm    Remember:  Do not eat or drink after midnight the night before your surgery    Take these medicines the morning of surgery with A SIP OF WATER  nitroGLYCERIN (NITROSTAT) if needed  Follow your surgeon's instructions on when to stop clopidogrel (PLAVIX) .  If no instructions were given by your surgeon then you will need to call the office to get those instructions.     As of today, STOP taking any Aspirin (unless otherwise instructed by your surgeon), Aleve, Naproxen, Ibuprofen, Motrin, Advil, Goody's, BC's, all herbal medications, fish oil, and all vitamins.    The Morning of Surgery  Do not wear jewelry, make-up or nail polish.  Do not wear lotions, powders, or perfumes/colognes, or deodorant  Do not shave 48 hours prior to surgery.  Men may shave face and neck.  Do not bring valuables to the hospital.  [facility] is not responsible for any belongings or valuables.  If you are a smoker, DO NOT Smoke 24 hours prior to surgery  If you wear a CPAP at night please bring your mask the morning of surgery   Remember that you must have someone to transport you home after your surgery, and remain with you for 24 hours if you are discharged the same day.   Please bring cases for contacts, glasses, hearing aids, dentures or bridgework because it cannot be worn into surgery.    Leave your suitcase in the car.  After surgery it may be brought to your room.  For patients admitted to the hospital, discharge  time will be determined by your treatment team.  Patients discharged the day of surgery will not be allowed to drive home.    Special instructions:   [place]- Preparing For Surgery  Before surgery, you can play an important role. Because skin is not sterile, your skin needs to be as free of germs as possible. You can reduce the number of germs on your skin by washing with CHG (chlorahexidine gluconate) Soap before surgery.  CHG is an antiseptic cleaner which kills germs and bonds with the skin to continue killing germs even after washing.    Oral Hygiene is also important to reduce your risk of infection.  Remember - BRUSH YOUR TEETH THE MORNING OF SURGERY WITH YOUR REGULAR TOOTHPASTE  Please do not use if you have an allergy to CHG or antibacterial soaps. If your skin becomes reddened/irritated stop using the CHG.  Do not shave (including legs and underarms) for at least 48 hours prior to first CHG shower. It is OK to shave your face.  Please follow these instructions carefully.   1. Shower the NIGHT BEFORE SURGERY and the MORNING OF SURGERY with CHG Soap.   2. If you chose to wash your hair, wash your hair first as usual with your normal shampoo.  3. After you shampoo, rinse your hair and body thoroughly to remove the shampoo.  4. Use CHG as you would any other liquid  soap. You can apply CHG directly to the skin and wash gently with a scrungie or a clean washcloth.   5. Apply the CHG Soap to your body ONLY FROM THE NECK DOWN.  Do not use on open wounds or open sores. Avoid contact with your eyes, ears, mouth and genitals (private parts). Wash Face and genitals (private parts)  with your normal soap.   6. Wash thoroughly, paying special attention to the area where your surgery will be performed.  7. Thoroughly rinse your body with warm water from the neck down.  8. DO NOT shower/wash with your normal soap after using and rinsing off the CHG Soap.  9. Pat yourself dry with a  CLEAN TOWEL.  10. Wear CLEAN PAJAMAS to bed the night before surgery, wear comfortable clothes the morning of surgery  11. Place CLEAN SHEETS on your bed the night of your first shower and DO NOT SLEEP WITH PETS.    Day of Surgery:  Please shower the morning of surgery with the CHG soap Do not apply any deodorants/lotions. Please wear clean clothes to the hospital/surgery center.   Remember to brush your teeth WITH YOUR REGULAR TOOTHPASTE.   Please read over the following fact sheets that you were given.

## 2019-06-10 ENCOUNTER — Other Ambulatory Visit: Payer: Self-pay

## 2019-06-10 ENCOUNTER — Ambulatory Visit (HOSPITAL_COMMUNITY)
Admission: RE | Admit: 2019-06-10 | Discharge: 2019-06-10 | Disposition: A | Discharge status: Home / Self Care | Payer: No Typology Code available for payment source | Source: Ambulatory Visit | Attending: Anesthesiology | Admitting: Anesthesiology

## 2019-06-10 ENCOUNTER — Encounter (HOSPITAL_COMMUNITY)
Admission: RE | Admit: 2019-06-10 | Discharge: 2019-06-10 | Disposition: A | Discharge status: Home / Self Care | Payer: No Typology Code available for payment source | Source: Ambulatory Visit | Attending: Vascular Surgery | Admitting: Vascular Surgery

## 2019-06-10 ENCOUNTER — Other Ambulatory Visit (HOSPITAL_COMMUNITY)
Admission: RE | Admit: 2019-06-10 | Discharge: 2019-06-10 | Disposition: A | Discharge status: Home / Self Care | Payer: No Typology Code available for payment source | Source: Ambulatory Visit | Attending: Vascular Surgery | Admitting: Vascular Surgery

## 2019-06-10 ENCOUNTER — Encounter (HOSPITAL_COMMUNITY): Payer: Self-pay

## 2019-06-10 DIAGNOSIS — Z01818 Encounter for other preprocedural examination: Secondary | ICD-10-CM

## 2019-06-10 DIAGNOSIS — I6523 Occlusion and stenosis of bilateral carotid arteries: Secondary | ICD-10-CM

## 2019-06-10 HISTORY — DX: Personal history of urinary calculi: Z87.442

## 2019-06-10 HISTORY — DX: Disorder of arteries and arterioles, unspecified: I77.9

## 2019-06-10 LAB — COMPREHENSIVE METABOLIC PANEL
ALT: 25 U/L (ref 0–44)
AST: 17 U/L (ref 15–41)
Albumin: 4 g/dL (ref 3.5–5.0)
Alkaline Phosphatase: 81 U/L (ref 38–126)
Anion gap: 11 (ref 5–15)
BUN: 16 mg/dL (ref 6–20)
CO2: 22 mmol/L (ref 22–32)
Calcium: 9 mg/dL (ref 8.9–10.3)
Chloride: 102 mmol/L (ref 98–111)
Creatinine, Ser: 1.09 mg/dL (ref 0.61–1.24)
GFR calc Af Amer: >60 mL/min (ref >60)
GFR calc non Af Amer: >60 mL/min (ref >60)
Glucose, Bld: 368 mg/dL — ABNORMAL HIGH (ref 70–99)
Potassium: 4.3 mmol/L (ref 3.5–5.1)
Sodium: 135 mmol/L (ref 135–145)
Total Bilirubin: 0.8 mg/dL (ref 0.3–1.2)
Total Protein: 7.2 g/dL (ref 6.5–8.1)

## 2019-06-10 LAB — ABO/RH: ABO/RH(D): O POS

## 2019-06-10 LAB — CBC
HCT: 46.9 % (ref 39.0–52.0)
Hemoglobin: 15.8 g/dL (ref 13.0–17.0)
MCH: 30.5 pg (ref 26.0–34.0)
MCHC: 33.7 g/dL (ref 30.0–36.0)
MCV: 90.5 fL (ref 80.0–100.0)
Platelets: 266 10*3/uL (ref 150–400)
RBC: 5.18 MIL/uL (ref 4.22–5.81)
RDW: 11.9 % (ref 11.5–15.5)
WBC: 9 10*3/uL (ref 4.0–10.5)
nRBC: 0 % (ref 0.0–0.2)

## 2019-06-10 LAB — TYPE AND SCREEN
ABO/RH(D): O POS
Antibody Screen: NEGATIVE

## 2019-06-10 LAB — APTT: aPTT: 28 seconds (ref 24–36)

## 2019-06-10 LAB — HEMOGLOBIN A1C
Hgb A1c MFr Bld: 9.8 % — ABNORMAL HIGH (ref 4.8–5.6)
Mean Plasma Glucose: 234.56 mg/dL

## 2019-06-10 LAB — URINALYSIS, ROUTINE W REFLEX MICROSCOPIC
Bacteria, UA: NONE SEEN
Bilirubin Urine: NEGATIVE
Glucose, UA: >=500 mg/dL — AB
Hgb urine dipstick: NEGATIVE
Ketones, ur: NEGATIVE mg/dL
Leukocytes,Ua: NEGATIVE
Nitrite: NEGATIVE
Protein, ur: NEGATIVE mg/dL
Specific Gravity, Urine: 1.02 (ref 1.005–1.030)
pH: 5 (ref 5.0–8.0)

## 2019-06-10 LAB — SURGICAL PCR SCREEN
MRSA, PCR: NEGATIVE
Staphylococcus aureus: NEGATIVE

## 2019-06-10 LAB — SARS CORONAVIRUS 2 (TAT 6-24 HRS): SARS Coronavirus 2: NEGATIVE

## 2019-06-10 LAB — PROTIME-INR
INR: 1 (ref 0.8–1.2)
Prothrombin Time: 12.9 seconds (ref 11.4–15.2)

## 2019-06-10 LAB — GLUCOSE, CAPILLARY: Glucose-Capillary: 370 mg/dL — ABNORMAL HIGH (ref 70–99)

## 2019-06-10 NOTE — Progress Notes (Signed)
Dr. Chestine Spore notified ([employer]) about patient's elevated A1C level of 9.8.

## 2019-06-10 NOTE — Progress Notes (Addendum)
PCP - Daphane Shepherd MD Cardiologist - Swaziland MD    Chest x-ray - 06/10/19 EKG - 05/27/19 Stress Test - 02/12/15 ECHO - 08/28/12 Cardiac Cath - 08/28/12    Blood Thinner Instructions: Plavix - per pt and surgeons orders to stop 5 days prior to surgery. Per pt, last dose was 06/06/19   COVID TEST- 06/10/19 after PAT appointment   Anesthesia review: yes, cardiac hx  Patient denies shortness of breath, fever, cough and chest pain at PAT appointment   All instructions explained to the patient, with a verbal understanding of the material. Patient agrees to go over the instructions while at home for a better understanding. Patient also instructed to self quarantine after being tested for COVID-19. The opportunity to ask questions was provided.

## 2019-06-11 ENCOUNTER — Ambulatory Visit (INDEPENDENT_AMBULATORY_CARE_PROVIDER_SITE_OTHER)
Admission: RE | Admit: 2019-06-11 | Discharge: 2019-06-11 | Disposition: A | Discharge status: Home / Self Care | Payer: No Typology Code available for payment source | Source: Ambulatory Visit | Attending: Surgery | Admitting: Surgery

## 2019-06-11 ENCOUNTER — Encounter (HOSPITAL_COMMUNITY): Payer: Self-pay

## 2019-06-11 ENCOUNTER — Other Ambulatory Visit: Payer: Self-pay

## 2019-06-11 DIAGNOSIS — I6523 Occlusion and stenosis of bilateral carotid arteries: Secondary | ICD-10-CM | POA: Diagnosis not present

## 2019-06-11 MED ORDER — VANCOMYCIN HCL 1500 MG/300ML IV SOLN
1500.0000 mg | INTRAVENOUS | Status: AC
  Administered 2019-06-12: 1500 mg via INTRAVENOUS
  Filled 2019-06-11 (×2): qty 300

## 2019-06-11 NOTE — Anesthesia Preprocedure Evaluation (Addendum)
Anesthesia Evaluation    Reviewed: Allergy & Precautions, H&P , Patient's Chart, lab work & pertinent test results  Airway Mallampati: II  TM Distance: >3 FB Neck ROM: Full    Dental  (+) Teeth Intact, Dental Advisory Given   Pulmonary neg pulmonary ROS, former smoker,    breath sounds clear to auscultation       Cardiovascular Exercise Tolerance: Good + CAD, + Past MI, + Cardiac Stents and + Peripheral Vascular Disease  negative cardio ROS   Rhythm:Regular Rate:Normal     Neuro/Psych negative neurological ROS  negative psych ROS   GI/Hepatic negative GI ROS, Neg liver ROS, GERD  ,  Endo/Other  negative endocrine ROS  Renal/GU negative Renal ROS  negative genitourinary   Musculoskeletal   Abdominal   Peds  Hematology negative hematology ROS (+)   Anesthesia Other Findings   Reproductive/Obstetrics negative OB ROS                            Anesthesia Physical Anesthesia Plan  ASA: III  Anesthesia Plan: General   Post-op Pain Management:    Induction: Intravenous  PONV Risk Score and Plan: 3 and Ondansetron, Dexamethasone and Midazolam  Airway Management Planned: Oral ETT  Additional Equipment: Arterial line  Intra-op Plan:   Post-operative Plan: Extubation in OR  Informed Consent: I have reviewed the patients History and Physical, chart, labs and discussed the procedure including the risks, benefits and alternatives for the proposed anesthesia with the patient or authorized representative who has indicated his/her understanding and acceptance.     Dental advisory given  Plan Discussed with: CRNA  Anesthesia Plan Comments: (PAT note written 06/11/2019 by Shonna Chock, PA-C. )       Anesthesia Quick Evaluation

## 2019-06-11 NOTE — Progress Notes (Addendum)
Anesthesia Chart Review:  Case: [id number] Date/Time: 06/12/19 0715   Procedure: RIGHT ENDARTERECTOMY CAROTID (Right )   Anesthesia type: General   Pre-op diagnosis: RIGHT CAROTID ARTERY STENOSIS   Location: [facility] ROOM 16 / [facility] OR   Surgeons: Cephus Shelling, MD      DISCUSSION: Patient is a 59 year old male scheduled for the above procedure.  History includes former smoker (quit 2013), CAD (STEMI, s/p DES RCA 08/28/12), HLD, carotid artery disease. BMI is consistent with obesity.  Preoperative cardiology evaluation as outlined by Azalee Course, PA on 05/27/19: "Mr. Roy Douglas (DOB 25-May-1960) was seen in the [facility] office today as part of preoperative clearance requested by vascular surgery.  He is able to ambulate on average 8000-9000 steps on a daily basis.  He can clearly accomplish at least 4 METS of activity without any issue. EKG obtained today did not showed significant ischemic changes. No further cardiac work-up is recommended at this time.  His RCRI risk is class III, 6.6% risk of major cardiac event during perioperative period.   Of note, he is not taking aspirin at home. I have instructed the patient to hold plavix for 5 days prior to the surgery and restart plavix as soon as possible after the surgery at the surgeon's discretion." Last Plavix 06/05/10.   06/10/19 presurgical COVID-19 test negative. Anesthesia team to evaluate on the day of surgery.    VS: BP (!) 149/91   Pulse 98   Temp 36.6 C (Oral)   Resp 19   Ht 5\' 9"  (1.753 m)   Wt 102.9 kg   SpO2 98%   BMI 33.51 kg/m    PROVIDERS: , MD is PCP  Fatima Sanger, Peter, MD is cardiologist  LABS: Preoperative labs noted. A1c 9.8%--PAT RN communicated result with surgeon.  (all labs ordered are listed, but only abnormal results are displayed)  Labs Reviewed  GLUCOSE, CAPILLARY - Abnormal; Notable for the following components:      Result Value   Glucose-Capillary 370 (*)    All  other components within normal limits  COMPREHENSIVE METABOLIC PANEL - Abnormal; Notable for the following components:   Glucose, Bld 368 (*)    All other components within normal limits  URINALYSIS, ROUTINE W REFLEX MICROSCOPIC - Abnormal; Notable for the following components:   Color, Urine STRAW (*)    Glucose, UA >=500 (*)    All other components within normal limits  HEMOGLOBIN A1C - Abnormal; Notable for the following components:   Hgb A1c MFr Bld 9.8 (*)    All other components within normal limits  SURGICAL PCR SCREEN  APTT  CBC  PROTIME-INR  TYPE AND SCREEN  ABO/RH     IMAGES: CXR 06/10/19: FINDINGS: Lungs are clear. Heart size and pulmonary vascularity are normal. No adenopathy. There is degenerative change in the thoracic spine. IMPRESSION: Lungs clear.  Cardiac silhouette normal.  No adenopathy.  CTA neck 12/24/18: IMPRESSION: 1. Bulky soft plaque at both carotid bifurcations resulting in hemodynamically significant stenosis: - high-grade stenosis of the Right ICA origin approaching a RADIOGRAPHIC-STRING-SIGN (see series 9, image 77), numerically estimated at 75%. - circumferential soft plaque in the distal Left CCA just proximal to the bifurcation with an estimated 65% stenosis (series 9, image 128). 2. Mild to moderate bilateral ICA siphon calcified plaque without significant stenosis. 3. Left greater than right vertebral artery origin plaque without significant stenosis.   EKG: 05/27/19 ([facility]): SR with frequent PVCs   CV:  Carotid US 11/11/18: Summary:  - Right Carotid: Velocities in the right ICA are consistent with a 40-59% stenosis.  - Left Carotid: Velocities in the left ICA are consistent with a 40-59%  stenosis. Hemodynamically significant plaque >50% visualized in the CCA. The ECA appears >50% stenosed. Severe stenosis in the distal LCCA, extending to the ostial LICA.  - Vertebrals: Bilateral vertebral arteries demonstrate antegrade  flow.  - Subclavians: Normal flow hemodynamics were seen in bilateral subclavian arteries.     ETT 02/12/15:  Blood pressure demonstrated a hypertensive response to exercise.  There was no ST segment deviation noted during stress. ETT with good exercise tolerance (11:30); hypertensive response; no chest pain; no ST changes; Duke TM score 12; negative adequate ETT.   Echo 08/28/12: Study Conclusions  Left ventricle: The cavity size was normal. Wall thickness  was normal. Systolic function was normal. The estimated  ejection fraction was in the range of 50% to 55%. Wall  motion was normal; there were no regional wall motion  abnormalities.      Cardiac cath 08/28/12: Final Conclusions:   1. Single vessel obstructive coronary artery disease. (mid LAD 30-40%, 95% proximal RCA extending through mid vessel up to 50-70%) 2. Mild left ventricular dysfunction. 3. Successful stenting of the proximal to mid RCA with drug-eluting stents.    Past Medical History:  Diagnosis Date  . CAD (coronary artery disease)   . GERD (gastroesophageal reflux disease)   . Gout   . History of kidney stones    11/2015 per pt but it passed 06/10/19  . Hyperlipidemia   . Subsequent ST elevation (STEMI) myocardial infarction of inferior wall ([place])    stent RCA DES 08/28/12    Past Surgical History:  Procedure Laterality Date  . ANKLE SURGERY    . KNEE SURGERY    . LEFT HEART CATHETERIZATION WITH CORONARY ANGIOGRAM N/A 08/28/2012   Procedure: LEFT HEART CATHETERIZATION WITH CORONARY ANGIOGRAM;  Surgeon: Peter M Martinique, MD;  Location: [facility] CATH LAB;  Service: Cardiovascular;  Laterality: N/A;    MEDICATIONS: . atorvastatin (LIPITOR) 80 MG tablet  . clopidogrel (PLAVIX) 75 MG tablet  . Cyanocobalamin (VITAMIN B 12 PO)  . Multiple Vitamins-Minerals (MULTIVITAMIN WITH MINERALS) tablet  . nitroGLYCERIN (NITROSTAT) 0.4 MG SL tablet  . Omega-3 Fatty Acids (FISH OIL CONCENTRATE) 1000 MG CAPS   No current  facility-administered medications for this encounter.    Myra Gianotti, PA-C Surgical Short Stay/Anesthesiology [facility] Phone [phone] [facility] Phone [phone] 06/11/2019 10:39 AM

## 2019-06-12 ENCOUNTER — Inpatient Hospital Stay (HOSPITAL_COMMUNITY): Payer: No Typology Code available for payment source | Admitting: Vascular Surgery

## 2019-06-12 ENCOUNTER — Inpatient Hospital Stay (HOSPITAL_COMMUNITY)
Admission: RE | Admit: 2019-06-12 | Discharge: 2019-06-13 | DRG: 039 | Disposition: A | Discharge status: Home / Self Care | Payer: No Typology Code available for payment source | Attending: Vascular Surgery | Admitting: Vascular Surgery

## 2019-06-12 ENCOUNTER — Inpatient Hospital Stay (HOSPITAL_COMMUNITY): Payer: No Typology Code available for payment source | Admitting: Anesthesiology

## 2019-06-12 ENCOUNTER — Encounter (HOSPITAL_COMMUNITY): Payer: Self-pay | Admitting: Vascular Surgery

## 2019-06-12 ENCOUNTER — Encounter (HOSPITAL_COMMUNITY)
Admission: RE | Disposition: A | Discharge status: Home / Self Care | Payer: Self-pay | Source: Home / Self Care | Attending: Vascular Surgery

## 2019-06-12 ENCOUNTER — Other Ambulatory Visit: Payer: Self-pay

## 2019-06-12 DIAGNOSIS — E119 Type 2 diabetes mellitus without complications: Secondary | ICD-10-CM | POA: Diagnosis present

## 2019-06-12 DIAGNOSIS — E669 Obesity, unspecified: Secondary | ICD-10-CM | POA: Diagnosis present

## 2019-06-12 DIAGNOSIS — I6521 Occlusion and stenosis of right carotid artery: Principal | ICD-10-CM | POA: Diagnosis present

## 2019-06-12 DIAGNOSIS — Z20822 Contact with and (suspected) exposure to covid-19: Secondary | ICD-10-CM | POA: Diagnosis present

## 2019-06-12 DIAGNOSIS — Z8249 Family history of ischemic heart disease and other diseases of the circulatory system: Secondary | ICD-10-CM | POA: Diagnosis not present

## 2019-06-12 DIAGNOSIS — Z955 Presence of coronary angioplasty implant and graft: Secondary | ICD-10-CM | POA: Diagnosis not present

## 2019-06-12 DIAGNOSIS — I2510 Atherosclerotic heart disease of native coronary artery without angina pectoris: Secondary | ICD-10-CM | POA: Diagnosis present

## 2019-06-12 DIAGNOSIS — Z6833 Body mass index (BMI) 33.0-33.9, adult: Secondary | ICD-10-CM

## 2019-06-12 DIAGNOSIS — Z7902 Long term (current) use of antithrombotics/antiplatelets: Secondary | ICD-10-CM

## 2019-06-12 DIAGNOSIS — Z87891 Personal history of nicotine dependence: Secondary | ICD-10-CM

## 2019-06-12 DIAGNOSIS — Z8349 Family history of other endocrine, nutritional and metabolic diseases: Secondary | ICD-10-CM

## 2019-06-12 DIAGNOSIS — Z91013 Allergy to seafood: Secondary | ICD-10-CM | POA: Diagnosis not present

## 2019-06-12 DIAGNOSIS — I252 Old myocardial infarction: Secondary | ICD-10-CM

## 2019-06-12 DIAGNOSIS — Z881 Allergy status to other antibiotic agents status: Secondary | ICD-10-CM | POA: Diagnosis not present

## 2019-06-12 DIAGNOSIS — E785 Hyperlipidemia, unspecified: Secondary | ICD-10-CM | POA: Diagnosis present

## 2019-06-12 DIAGNOSIS — M109 Gout, unspecified: Secondary | ICD-10-CM | POA: Diagnosis present

## 2019-06-12 DIAGNOSIS — I6529 Occlusion and stenosis of unspecified carotid artery: Secondary | ICD-10-CM | POA: Diagnosis present

## 2019-06-12 DIAGNOSIS — K219 Gastro-esophageal reflux disease without esophagitis: Secondary | ICD-10-CM | POA: Diagnosis present

## 2019-06-12 HISTORY — PX: ENDARTERECTOMY: SHX5162

## 2019-06-12 LAB — GLUCOSE, CAPILLARY
Glucose-Capillary: 255 mg/dL — ABNORMAL HIGH (ref 70–99)
Glucose-Capillary: 217 mg/dL — ABNORMAL HIGH (ref 70–99)
Glucose-Capillary: 231 mg/dL — ABNORMAL HIGH (ref 70–99)
Glucose-Capillary: 237 mg/dL — ABNORMAL HIGH (ref 70–99)
Glucose-Capillary: 195 mg/dL — ABNORMAL HIGH (ref 70–99)

## 2019-06-12 SURGERY — ENDARTERECTOMY, CAROTID
Anesthesia: General | Site: Neck | Laterality: Right

## 2019-06-12 MED ORDER — ONDANSETRON HCL 4 MG/2ML IJ SOLN
INTRAMUSCULAR | Status: AC
  Filled 2019-06-12: qty 2

## 2019-06-12 MED ORDER — LACTATED RINGERS IV SOLN: INTRAVENOUS | Status: DC

## 2019-06-12 MED ORDER — INSULIN ASPART 100 UNIT/ML ~~LOC~~ SOLN
0.0000 [IU] | Freq: Three times a day (TID) | SUBCUTANEOUS | Status: DC
  Administered 2019-06-12 – 2019-06-13 (×2): 5 [IU] via SUBCUTANEOUS

## 2019-06-12 MED ORDER — GUAIFENESIN-DM 100-10 MG/5ML PO SYRP: 15.0000 mL | ORAL_SOLUTION | ORAL | Status: DC | PRN

## 2019-06-12 MED ORDER — SODIUM CHLORIDE 0.9 % IV SOLN
500.0000 mL | Freq: Once | INTRAVENOUS | Status: AC | PRN
  Administered 2019-06-12: 500 mL via INTRAVENOUS

## 2019-06-12 MED ORDER — SODIUM CHLORIDE 0.9 % IV SOLN
INTRAVENOUS | Status: AC
  Filled 2019-06-12: qty 1.2

## 2019-06-12 MED ORDER — FENTANYL CITRATE (PF) 250 MCG/5ML IJ SOLN
INTRAMUSCULAR | Status: AC
  Filled 2019-06-12: qty 5

## 2019-06-12 MED ORDER — CLEVIDIPINE BUTYRATE 0.5 MG/ML IV EMUL
INTRAVENOUS | Status: DC | PRN
  Administered 2019-06-12: 2 mg/h via INTRAVENOUS

## 2019-06-12 MED ORDER — EPHEDRINE 5 MG/ML INJ
INTRAVENOUS | Status: AC
  Filled 2019-06-12: qty 10

## 2019-06-12 MED ORDER — SODIUM CHLORIDE 0.9 % IV SOLN: INTRAVENOUS | Status: DC

## 2019-06-12 MED ORDER — HEMOSTATIC AGENTS (NO CHARGE) OPTIME
TOPICAL | Status: DC | PRN
  Administered 2019-06-12: 1 via TOPICAL

## 2019-06-12 MED ORDER — INSULIN ASPART 100 UNIT/ML ~~LOC~~ SOLN
SUBCUTANEOUS | Status: DC | PRN
  Administered 2019-06-12: 4 [IU] via INTRAVENOUS

## 2019-06-12 MED ORDER — INSULIN ASPART 100 UNIT/ML ~~LOC~~ SOLN: 2.0000 [IU] | Freq: Once | SUBCUTANEOUS | Status: AC

## 2019-06-12 MED ORDER — ROCURONIUM BROMIDE 10 MG/ML (PF) SYRINGE
PREFILLED_SYRINGE | INTRAVENOUS | Status: AC
  Filled 2019-06-12: qty 10

## 2019-06-12 MED ORDER — ALUM & MAG HYDROXIDE-SIMETH 200-200-20 MG/5ML PO SUSP: 15.0000 mL | ORAL | Status: DC | PRN

## 2019-06-12 MED ORDER — 0.9 % SODIUM CHLORIDE (POUR BTL) OPTIME
TOPICAL | Status: DC | PRN
  Administered 2019-06-12: 1000 mL

## 2019-06-12 MED ORDER — FENTANYL CITRATE (PF) 100 MCG/2ML IJ SOLN
INTRAMUSCULAR | Status: DC | PRN
  Administered 2019-06-12 (×2): 25 ug via INTRAVENOUS
  Administered 2019-06-12 (×2): 50 ug via INTRAVENOUS
  Administered 2019-06-12: 150 ug via INTRAVENOUS

## 2019-06-12 MED ORDER — METOPROLOL TARTRATE 5 MG/5ML IV SOLN: 2.0000 mg | INTRAVENOUS | Status: DC | PRN

## 2019-06-12 MED ORDER — ACETAMINOPHEN 500 MG PO TABS: 1000.0000 mg | ORAL_TABLET | Freq: Once | ORAL | Status: AC

## 2019-06-12 MED ORDER — CHLORHEXIDINE GLUCONATE CLOTH 2 % EX PADS: 6.0000 | MEDICATED_PAD | Freq: Once | CUTANEOUS | Status: DC

## 2019-06-12 MED ORDER — ROCURONIUM BROMIDE 10 MG/ML (PF) SYRINGE
PREFILLED_SYRINGE | INTRAVENOUS | Status: DC | PRN
  Administered 2019-06-12: 100 mg via INTRAVENOUS
  Administered 2019-06-12: 20 mg via INTRAVENOUS

## 2019-06-12 MED ORDER — DIPHENHYDRAMINE HCL 50 MG/ML IJ SOLN
INTRAMUSCULAR | Status: AC
  Filled 2019-06-12: qty 1

## 2019-06-12 MED ORDER — INSULIN ASPART 100 UNIT/ML ~~LOC~~ SOLN
SUBCUTANEOUS | Status: AC
  Filled 2019-06-12: qty 1

## 2019-06-12 MED ORDER — DOCUSATE SODIUM 100 MG PO CAPS
100.0000 mg | ORAL_CAPSULE | Freq: Every day | ORAL | Status: DC
  Administered 2019-06-13: 09:00:00 100 mg via ORAL
  Filled 2019-06-12: qty 1

## 2019-06-12 MED ORDER — HYDROMORPHONE HCL 1 MG/ML IJ SOLN: 0.2500 mg | INTRAMUSCULAR | Status: DC | PRN

## 2019-06-12 MED ORDER — EPHEDRINE SULFATE-NACL 50-0.9 MG/10ML-% IV SOSY
PREFILLED_SYRINGE | INTRAVENOUS | Status: DC | PRN
  Administered 2019-06-12 (×2): 10 mg via INTRAVENOUS

## 2019-06-12 MED ORDER — HEPARIN SODIUM (PORCINE) 1000 UNIT/ML IJ SOLN
INTRAMUSCULAR | Status: AC
  Filled 2019-06-12: qty 2

## 2019-06-12 MED ORDER — HYDRALAZINE HCL 20 MG/ML IJ SOLN: 5.0000 mg | INTRAMUSCULAR | Status: DC | PRN

## 2019-06-12 MED ORDER — LACTATED RINGERS IV SOLN: INTRAVENOUS | Status: DC | PRN

## 2019-06-12 MED ORDER — ASPIRIN EC 81 MG PO TBEC
81.0000 mg | DELAYED_RELEASE_TABLET | Freq: Every day | ORAL | Status: DC
  Administered 2019-06-13: 81 mg via ORAL
  Filled 2019-06-12: qty 1

## 2019-06-12 MED ORDER — SUGAMMADEX SODIUM 200 MG/2ML IV SOLN
INTRAVENOUS | Status: DC | PRN
  Administered 2019-06-12: 200 mg via INTRAVENOUS

## 2019-06-12 MED ORDER — ATORVASTATIN CALCIUM 80 MG PO TABS
80.0000 mg | ORAL_TABLET | Freq: Every day | ORAL | Status: DC
  Administered 2019-06-12: 80 mg via ORAL
  Filled 2019-06-12: qty 1

## 2019-06-12 MED ORDER — PANTOPRAZOLE SODIUM 40 MG PO TBEC
40.0000 mg | DELAYED_RELEASE_TABLET | Freq: Every day | ORAL | Status: DC
  Administered 2019-06-12 – 2019-06-13 (×2): 40 mg via ORAL
  Filled 2019-06-12 (×2): qty 1

## 2019-06-12 MED ORDER — PHENYLEPHRINE HCL-NACL 10-0.9 MG/250ML-% IV SOLN
INTRAVENOUS | Status: DC | PRN
  Administered 2019-06-12: 25 ug/min via INTRAVENOUS

## 2019-06-12 MED ORDER — ACETAMINOPHEN 325 MG RE SUPP: 325.0000 mg | RECTAL | Status: DC | PRN

## 2019-06-12 MED ORDER — PROTAMINE SULFATE 10 MG/ML IV SOLN
INTRAVENOUS | Status: AC
  Filled 2019-06-12: qty 25

## 2019-06-12 MED ORDER — INSULIN ASPART 100 UNIT/ML ~~LOC~~ SOLN
SUBCUTANEOUS | Status: AC
  Administered 2019-06-12: 07:00:00 2 [IU] via SUBCUTANEOUS
  Filled 2019-06-12: qty 1

## 2019-06-12 MED ORDER — DIPHENHYDRAMINE HCL 50 MG/ML IJ SOLN
INTRAMUSCULAR | Status: DC | PRN
  Administered 2019-06-12: 6.25 mg via INTRAVENOUS

## 2019-06-12 MED ORDER — LIDOCAINE HCL (PF) 1 % IJ SOLN
INTRAMUSCULAR | Status: AC
  Filled 2019-06-12: qty 30

## 2019-06-12 MED ORDER — ACETAMINOPHEN 325 MG PO TABS
325.0000 mg | ORAL_TABLET | ORAL | Status: DC | PRN
  Administered 2019-06-12 – 2019-06-13 (×2): 650 mg via ORAL
  Filled 2019-06-12 (×2): qty 2

## 2019-06-12 MED ORDER — OXYCODONE-ACETAMINOPHEN 5-325 MG PO TABS
1.0000 | ORAL_TABLET | ORAL | Status: DC | PRN
  Administered 2019-06-12: 23:00:00 2 via ORAL
  Filled 2019-06-12: qty 2

## 2019-06-12 MED ORDER — CLOPIDOGREL BISULFATE 75 MG PO TABS
75.0000 mg | ORAL_TABLET | Freq: Every day | ORAL | Status: DC
  Administered 2019-06-12 – 2019-06-13 (×2): 75 mg via ORAL
  Filled 2019-06-12 (×2): qty 1

## 2019-06-12 MED ORDER — INSULIN ASPART 100 UNIT/ML ~~LOC~~ SOLN
5.0000 [IU] | Freq: Once | SUBCUTANEOUS | Status: AC
  Administered 2019-06-12: 12:00:00 5 [IU] via SUBCUTANEOUS

## 2019-06-12 MED ORDER — SODIUM CHLORIDE 0.9 % IV SOLN
INTRAVENOUS | Status: DC | PRN
  Administered 2019-06-12: 500 mL

## 2019-06-12 MED ORDER — PROTAMINE SULFATE 10 MG/ML IV SOLN
INTRAVENOUS | Status: DC | PRN
  Administered 2019-06-12: 50 mg via INTRAVENOUS

## 2019-06-12 MED ORDER — ONDANSETRON HCL 4 MG/2ML IJ SOLN: 4.0000 mg | Freq: Four times a day (QID) | INTRAMUSCULAR | Status: DC | PRN

## 2019-06-12 MED ORDER — LABETALOL HCL 5 MG/ML IV SOLN: 10.0000 mg | INTRAVENOUS | Status: DC | PRN

## 2019-06-12 MED ORDER — ACETAMINOPHEN 500 MG PO TABS
ORAL_TABLET | ORAL | Status: AC
  Administered 2019-06-12: 1000 mg via ORAL
  Filled 2019-06-12: qty 2

## 2019-06-12 MED ORDER — ONDANSETRON HCL 4 MG/2ML IJ SOLN
INTRAMUSCULAR | Status: DC | PRN
  Administered 2019-06-12: 4 mg via INTRAVENOUS

## 2019-06-12 MED ORDER — MORPHINE SULFATE (PF) 2 MG/ML IV SOLN: 2.0000 mg | INTRAVENOUS | Status: DC | PRN

## 2019-06-12 MED ORDER — PHENOL 1.4 % MT LIQD: 1.0000 | OROMUCOSAL | Status: DC | PRN

## 2019-06-12 MED ORDER — PROPOFOL 10 MG/ML IV BOLUS
INTRAVENOUS | Status: AC
  Filled 2019-06-12: qty 20

## 2019-06-12 MED ORDER — HEPARIN SODIUM (PORCINE) 1000 UNIT/ML IJ SOLN
INTRAMUSCULAR | Status: DC | PRN
  Administered 2019-06-12: 2000 [IU] via INTRAVENOUS
  Administered 2019-06-12: 10000 [IU] via INTRAVENOUS

## 2019-06-12 MED ORDER — MAGNESIUM SULFATE 2 GM/50ML IV SOLN: 2.0000 g | Freq: Every day | INTRAVENOUS | Status: DC | PRN

## 2019-06-12 MED ORDER — POTASSIUM CHLORIDE CRYS ER 20 MEQ PO TBCR: 20.0000 meq | EXTENDED_RELEASE_TABLET | Freq: Every day | ORAL | Status: DC | PRN

## 2019-06-12 MED ORDER — LIDOCAINE 2% (20 MG/ML) 5 ML SYRINGE
INTRAMUSCULAR | Status: DC | PRN
  Administered 2019-06-12: 60 mg via INTRAVENOUS

## 2019-06-12 MED ORDER — PROPOFOL 10 MG/ML IV BOLUS
INTRAVENOUS | Status: DC | PRN
  Administered 2019-06-12 (×2): 50 mg via INTRAVENOUS
  Administered 2019-06-12: 150 mg via INTRAVENOUS

## 2019-06-12 SURGICAL SUPPLY — 50 items
CANISTER SUCT 3000ML PPV (MISCELLANEOUS) ×3 IMPLANT
CATH ROBINSON RED A/P 18FR (CATHETERS) ×3 IMPLANT
CLIP VESOCCLUDE MED 24/CT (CLIP) ×3 IMPLANT
CLIP VESOCCLUDE SM WIDE 24/CT (CLIP) ×3 IMPLANT
COVER PROBE W GEL 5X96 (DRAPES) ×3 IMPLANT
COVER TRANSDUCER ULTRASND GEL (DRAPE) ×3 IMPLANT
COVER WAND RF STERILE (DRAPES) IMPLANT
DERMABOND ADVANCED (GAUZE/BANDAGES/DRESSINGS) ×2
DERMABOND ADVANCED .7 DNX12 (GAUZE/BANDAGES/DRESSINGS) ×1 IMPLANT
DRAIN CHANNEL 15F RND FF W/TCR (WOUND CARE) IMPLANT
ELECT REM PT RETURN 9FT ADLT (ELECTROSURGICAL) ×3
ELECTRODE REM PT RTRN 9FT ADLT (ELECTROSURGICAL) ×1 IMPLANT
EVACUATOR SILICONE 100CC (DRAIN) IMPLANT
GLOVE BIO SURGEON STRL SZ7.5 (GLOVE) ×3 IMPLANT
GLOVE BIOGEL PI IND STRL 8 (GLOVE) ×1 IMPLANT
GLOVE BIOGEL PI INDICATOR 8 (GLOVE) ×2
GOWN STRL REUS W/ TWL LRG LVL3 (GOWN DISPOSABLE) ×2 IMPLANT
GOWN STRL REUS W/ TWL XL LVL3 (GOWN DISPOSABLE) ×2 IMPLANT
GOWN STRL REUS W/TWL LRG LVL3 (GOWN DISPOSABLE) ×4
GOWN STRL REUS W/TWL XL LVL3 (GOWN DISPOSABLE) ×4
HEMOSTAT SNOW SURGICEL 2X4 (HEMOSTASIS) ×3 IMPLANT
IV ADAPTER SYR DOUBLE MALE LL (MISCELLANEOUS) IMPLANT
KIT BASIN OR (CUSTOM PROCEDURE TRAY) ×3 IMPLANT
KIT SHUNT ARGYLE CAROTID ART 6 (VASCULAR PRODUCTS) IMPLANT
KIT TURNOVER KIT B (KITS) ×3 IMPLANT
LOOP VESSEL MINI RED (MISCELLANEOUS) IMPLANT
NEEDLE HYPO 25GX1X1/2 BEV (NEEDLE) IMPLANT
NEEDLE SPNL 20GX3.5 QUINCKE YW (NEEDLE) IMPLANT
NS IRRIG 1000ML POUR BTL (IV SOLUTION) ×9 IMPLANT
PACK CAROTID (CUSTOM PROCEDURE TRAY) ×3 IMPLANT
PAD ARMBOARD 7.5X6 YLW CONV (MISCELLANEOUS) ×6 IMPLANT
PATCH VASC XENOSURE 1CMX6CM (Vascular Products) ×2 IMPLANT
PATCH VASC XENOSURE 1X6 (Vascular Products) ×1 IMPLANT
POSITIONER HEAD DONUT 9IN (MISCELLANEOUS) ×3 IMPLANT
SHUNT CAROTID BYPASS 10 (VASCULAR PRODUCTS) ×3 IMPLANT
SHUNT CAROTID BYPASS 12FRX15.5 (VASCULAR PRODUCTS) IMPLANT
SPONGE SURGIFOAM ABS GEL 100 (HEMOSTASIS) IMPLANT
STOPCOCK 4 WAY LG BORE MALE ST (IV SETS) IMPLANT
SUT ETHILON 3 0 PS 1 (SUTURE) IMPLANT
SUT MNCRL AB 4-0 PS2 18 (SUTURE) ×3 IMPLANT
SUT PROLENE 5 0 C 1 24 (SUTURE) ×6 IMPLANT
SUT PROLENE 6 0 BV (SUTURE) ×3 IMPLANT
SUT SILK 3 0 (SUTURE)
SUT SILK 3-0 18XBRD TIE 12 (SUTURE) IMPLANT
SUT VIC AB 3-0 SH 27 (SUTURE) ×2
SUT VIC AB 3-0 SH 27X BRD (SUTURE) ×1 IMPLANT
SYR CONTROL 10ML LL (SYRINGE) IMPLANT
TOWEL GREEN STERILE (TOWEL DISPOSABLE) ×3 IMPLANT
TUBING ART PRESS 48 MALE/FEM (TUBING) IMPLANT
WATER STERILE IRR 1000ML POUR (IV SOLUTION) ×3 IMPLANT

## 2019-06-12 NOTE — Anesthesia Postprocedure Evaluation (Signed)
Anesthesia Post Note  Patient: Roy Douglas  Procedure(s) Performed: RIGHT ENDARTERECTOMY CAROTID (Right Neck)     Patient location during evaluation: PACU Anesthesia Type: General Level of consciousness: awake and alert Pain management: pain level controlled Vital Signs Assessment: post-procedure vital signs reviewed and stable Respiratory status: spontaneous breathing, nonlabored ventilation and respiratory function stable Cardiovascular status: blood pressure returned to baseline and stable Postop Assessment: no apparent nausea or vomiting Anesthetic complications: no    Last Vitals:  Vitals:   06/12/19 1220 06/12/19 1242  BP: 102/64   Pulse: 66 (!) 54  Resp: 16 11  Temp: 37.1 C   SpO2: 98% 97%    Last Pain:  Vitals:   06/12/19 1220  TempSrc: Oral  PainSc: 0-No pain                 Zaniah Titterington,W. EDMOND

## 2019-06-12 NOTE — Anesthesia Procedure Notes (Signed)
Arterial Line Insertion Start/End1/28/2021 7:10 AM, 06/12/2019 7:22 AM Performed by: Quentin Ore, CRNA, CRNA  Preanesthetic checklist: patient identified, IV checked, site marked, risks and benefits discussed, surgical consent, monitors and equipment checked, pre-op evaluation and timeout performed Lidocaine 1% used for infiltration and patient sedated Left, radial was placed Catheter size: 20 G Hand hygiene performed , maximum sterile barriers used  and Seldinger technique used Allen's test indicative of satisfactory collateral circulation Attempts: 2 Procedure performed without using ultrasound guided technique. Following insertion, Biopatch and dressing applied. Post procedure assessment: normal  Patient tolerated the procedure well with no immediate complications.

## 2019-06-12 NOTE — Anesthesia Procedure Notes (Addendum)
Procedure Name: Intubation Date/Time: 06/12/2019 7:47 AM Performed by: Kyung Rudd, CRNA Pre-anesthesia Checklist: Patient identified, Emergency Drugs available, Suction available and Patient being monitored Patient Re-evaluated:Patient Re-evaluated prior to induction Oxygen Delivery Method: Circle system utilized Preoxygenation: Pre-oxygenation with 100% oxygen Induction Type: IV induction Ventilation: Mask ventilation without difficulty and Oral airway inserted - appropriate to patient size Laryngoscope Size: Mac and 4 Grade View: Grade II Tube type: Oral Tube size: 7.5 mm Number of attempts: 2 Airway Equipment and Method: Video-laryngoscopy Placement Confirmation: ETT inserted through vocal cords under direct vision,  positive ETCO2 and breath sounds checked- equal and bilateral Secured at: 22 cm Tube secured with: Tape Dental Injury: Teeth and Oropharynx as per pre-operative assessment  Comments: DL x1 with MAC 4, Grade II view. Unable to easily pass ETT. 2nd DL with Glidescope 4, Grade I view on screen. AOI, +ETCO2 & BBS=.

## 2019-06-12 NOTE — Transfer of Care (Signed)
Immediate Anesthesia Transfer of Care Note  Patient: Roy Douglas  Procedure(s) Performed: RIGHT ENDARTERECTOMY CAROTID (Right Neck)  Patient Location: PACU  Anesthesia Type:General  Level of Consciousness: awake, alert  and oriented  Airway & Oxygen Therapy: Patient Spontanous Breathing  Post-op Assessment: Report given to RN, Post -op Vital signs reviewed and stable and Patient moving all extremities X 4  Post vital signs: Reviewed and stable  Last Vitals:  Vitals Value Taken Time  BP 114/68 06/12/19 1009  Temp 36.7 C 06/12/19 1009  Pulse 65 06/12/19 1020  Resp 14 06/12/19 1020  SpO2 93 % 06/12/19 1020  Vitals shown include unvalidated device data.  Last Pain:  Vitals:   06/12/19 1009  PainSc: 0-No pain         Complications: No apparent anesthesia complications

## 2019-06-12 NOTE — Discharge Instructions (Signed)
   [facility]  Discharge Instructions   Carotid Endarterectomy (CEA)  Please refer to the following instructions for your post-procedure care. Your surgeon or physician assistant will discuss any changes with you.  Activity  You are encouraged to walk as much as you can. You can slowly return to normal activities but must avoid strenuous activity and heavy lifting until your doctor tell you it's OK. Avoid activities such as vacuuming or swinging a golf club. You can drive after one week if you are comfortable and you are no longer taking prescription pain medications. It is normal to feel tired for serval weeks after your surgery. It is also normal to have difficulty with sleep habits, eating, and bowel movements after surgery. These will go away with time.  Bathing/Showering  You may shower after you come home. Do not soak in a bathtub, hot tub, or swim until the incision heals completely.  Incision Care  Shower every day. Clean your incision with mild soap and water. Pat the area dry with a clean towel. You do not need a bandage unless otherwise instructed. Do not apply any ointments or creams to your incision. You may have skin glue on your incision. Do not peel it off. It will come off on its own in about one week. Your incision may feel thickened and raised for several weeks after your surgery. This is normal and the skin will soften over time. For Men Only: It's OK to shave around the incision but do not shave the incision itself for 2 weeks. It is common to have numbness under your chin that could last for several months.  Diet  Resume your normal diet. There are no special food restrictions following this procedure. A low fat/low cholesterol diet is recommended for all patients with vascular disease. In order to heal from your surgery, it is CRITICAL to get adequate nutrition. Your body requires vitamins, minerals, and protein. Vegetables are the best  source of vitamins and minerals. Vegetables also provide the perfect balance of protein. Processed food has little nutritional value, so try to avoid this.        Medications  Resume taking all of your medications unless your doctor or physician assistant tells you not to. If your incision is causing pain, you may take over-the- counter pain relievers such as acetaminophen (Tylenol). If you were prescribed a stronger pain medication, please be aware these medications can cause nausea and constipation. Prevent nausea by taking the medication with a snack or meal. Avoid constipation by drinking plenty of fluids and eating foods with a high amount of fiber, such as fruits, vegetables, and grains. Do not take Tylenol if you are taking prescription pain medications.  Follow Up  Our office will schedule a follow up appointment 2-3 weeks following discharge.  Please call us immediately for any of the following conditions  Increased pain, redness, drainage (pus) from your incision site. Fever of 101 degrees or higher. If you should develop stroke (slurred speech, difficulty swallowing, weakness on one side of your body, loss of vision) you should call 911 and go to the nearest emergency room.  Reduce your risk of vascular disease:  Stop smoking. If you would like help call [facility] at 1-800-QUIT-NOW (1-800-784-8669) or [facility] at 336-586-4000. Manage your cholesterol Maintain a desired weight Control your diabetes Keep your blood pressure down  If you have any questions, please call the office at [phone].   

## 2019-06-12 NOTE — H&P (Signed)
History and Physical Interval Note:  06/12/2019 7:16 AM  Roy Douglas  has presented today for surgery, with the diagnosis of RIGHT CAROTID ARTERY STENOSIS.  The various methods of treatment have been discussed with the patient and family. After consideration of risks, benefits and other options for treatment, the patient has consented to  Procedure(s): RIGHT ENDARTERECTOMY CAROTID (Right) as a surgical intervention.  The patient's history has been reviewed, patient examined, no change in status, stable for surgery.  I have reviewed the patient's chart and labs.  Questions were answered to the patient's satisfaction.    Previously seen for evaluation of carotid disease.  We ultimately got a CTA that confirmed the right ICA had a higher grade stenosis greater than 80%.  I got a carotid duplex yesterday to confirm that his right ICA remains patent given that we initially saw him about 6 months ago.  Right ICA proximal velocity 405/199 confirming greater than 80% stenosis.  Plan for right carotid endarterectomy today after risk benefits discussed.  Cephus Shelling  Patient name: Roy Douglas MRN: [id number] DOB: 10/23/1960 Sex: male  REASON FOR CONSULT: Evaluate carotid stenosis  HPI:  Roy Douglas is a 59 y.o. male, with history of coronary artery disease status post STEMI requiring coronary stent and hyperlipidemia that presents for evaluation of carotid disease. Patient denies any previous history of stroke or TIA. He denies any vision loss or weakness in arm or leg. He states his carotid disease was initially evaluated by his cardiologist has been followed for some time. His cardiologist is Dr. [place] with [facility]. He denies any tobacco abuse. He states he has been losing weight after adjusting his diet. He is on plavix for his coronary stent. Takes a statin as well.      Past Medical History:  Diagnosis Date  . CAD (coronary artery disease)   . GERD (gastroesophageal reflux disease)    . Gout   . Hyperlipidemia   . Subsequent ST elevation (STEMI) myocardial infarction of inferior wall ([facility])    stent RCA DES 08/28/12        Past Surgical History:  Procedure Laterality Date  . ANKLE SURGERY    . KNEE SURGERY    . LEFT HEART CATHETERIZATION WITH CORONARY ANGIOGRAM N/A 08/28/2012   Procedure: LEFT HEART CATHETERIZATION WITH CORONARY ANGIOGRAM; Surgeon: Peter M Swaziland, MD; Location: [facility] CATH LAB; Service: Cardiovascular; Laterality: N/A;        Family History  Problem Relation Age of Onset  . Peripheral vascular disease Mother   . Hyperlipidemia Sister   . Hyperlipidemia Brother    SOCIAL HISTORY:  Social History        Socioeconomic History  . Marital status: Married    Spouse name: Not on file  . Number of children: Not on file  . Years of education: Not on file  . Highest education level: Not on file  Occupational History  . Not on file  Social Needs  . Financial resource strain: Not on file  . Food insecurity    Worry: Not on file    Inability: Not on file  . Transportation needs    Medical: Not on file    Non-medical: Not on file  Tobacco Use  . Smoking status: Former Smoker    Types: Cigarettes    Quit date: 07/30/2011    Years since quitting: 7.3  . Smokeless tobacco: Never Used  Substance and Sexual Activity  . Alcohol use: No  . Drug use: No  .  Sexual activity: Yes  Lifestyle  . Physical activity    Days per week: Not on file    Minutes per session: Not on file  . Stress: Not on file  Relationships  . Social Musician on phone: Not on file    Gets together: Not on file    Attends religious service: Not on file    Active member of club or organization: Not on file    Attends meetings of clubs or organizations: Not on file    Relationship status: Not on file  . Intimate partner violence    Fear of current or ex partner: Not on file    Emotionally abused: Not on file    Physically abused: Not on file    Forced sexual  activity: Not on file  Other Topics Concern  . Not on file  Social History Narrative  . Not on file        Allergies  Allergen Reactions  . Keflex [Cephalexin]     Mental changes         Current Outpatient Medications  Medication Sig Dispense Refill  . aspirin 81 MG tablet Take 1 tablet (81 mg total) by mouth daily. 30 tablet   . atorvastatin (LIPITOR) 80 MG tablet TAKE 1 TABLET (80 MG TOTAL) BY MOUTH DAILY AT 6 PM. NEED OV. 30 tablet 11  . clopidogrel (PLAVIX) 75 MG tablet TAKE 1 TABLET (75 MG TOTAL) BY MOUTH DAILY. NEED OV. 30 tablet 11  . Cyanocobalamin (VITAMIN B 12 PO) Take by mouth daily.    . Multiple Vitamins-Minerals (MULTIVITAMIN WITH MINERALS) tablet Take 1 tablet by mouth daily.    . nitroGLYCERIN (NITROSTAT) 0.4 MG SL tablet Place 1 tablet (0.4 mg total) under the tongue every 5 (five) minutes as needed for chest pain. 25 tablet 3  . Omega-3 Fatty Acids (FISH OIL CONCENTRATE PO) Take 1 capsule by mouth daily.     No current facility-administered medications for this visit.    REVIEW OF SYSTEMS:  [X]  denotes positive finding, [ ]  denotes negative finding  Cardiac  Comments:  Chest pain or chest pressure:    Shortness of breath upon exertion:    Short of breath when lying flat:    Irregular heart rhythm:        Vascular    Pain in calf, thigh, or hip brought on by ambulation:    Pain in feet at night that wakes you up from your sleep:     Blood clot in your veins:    Leg swelling:         Pulmonary    Oxygen at home:    Productive cough:     Wheezing:         Neurologic    Sudden weakness in arms or legs:     Sudden numbness in arms or legs:     Sudden onset of difficulty speaking or slurred speech:    Temporary loss of vision in one eye:     Problems with dizziness:         Gastrointestinal    Blood in stool:     Vomited blood:         Genitourinary    Burning when urinating:     Blood in urine:        Psychiatric    Major depression:          Hematologic    Bleeding problems:    Problems with blood  clotting too easily:        Skin    Rashes or ulcers:        Constitutional    Fever or chills:    PHYSICAL EXAM:     Vitals:   12/17/18 1106  BP: (!) 145/93  Pulse: (!) 59  Resp: 14  Temp: 98 F (36.7 C)  TempSrc: Temporal  SpO2: 100%  Weight: 223 lb (101.2 kg)  Height: 5\' 9"  (1.753 m)   GENERAL: The patient is a well-nourished male, in no acute distress. The vital signs are documented above.  CARDIAC: There is a regular rate and rhythm.  VASCULAR:  2+ radial pulse palpable bilaterally  2+ femoral pulse palpable bilatearlly  PULMONARY: There is good air exchange bilaterally without wheezing or rales.  ABDOMEN: Soft and non-tender with normal pitched bowel sounds.  MUSCULOSKELETAL: There are no major deformities or cyanosis.  NEUROLOGIC: No focal weakness or paresthesias are detected. CN II-XII grossly intact.  SKIN: There are no ulcers or rashes noted.  PSYCHIATRIC: The patient has a normal affect.  DATA:  I reviewed his carotid duplex which shows very elevated velocity in the distal left common carotid of 364/130. His left ICA velocities 189/51 suggesting a 40 to 59% stenosis but these velocities may be underestimated. Right ICA velocities 138/55 suggesting a 40 to 59% stenosis.  Assessment/Plan:  84-year male that presents with asymptomatic carotid disease. Based on his ICA velocities it would suggest his stenosis would be in the 40 to 59% range. However on the left he has a very elevated velocity in the distal common carotid of 364/130 and this may underestimate the velocities in his left ICA. As result I have recommended a CTA neck to further evaluate. I will contact him with the results. He can continue the statin as well as Plavix for cardiovascular risk reduction in the meantime. I will contact him with the CTA results and we will make plans for follow-up in 6 months with carotid duplex unless he need surgery  based on a CT. Discussed surgery is reserved for >80% stenosis in asymptomatic disease.  Marty Heck, MD  [facility]  Office: [phone]  Pager: [phone]

## 2019-06-12 NOTE — Progress Notes (Signed)
Inpatient Diabetes Program Recommendations  AACE/ADA: New Consensus Statement on Inpatient Glycemic Control (2015)  Target Ranges:  Prepandial:   less than 140 mg/dL      Peak postprandial:   less than 180 mg/dL (1-2 hours)      Critically ill patients:  140 - 180 mg/dL   Lab Results  Component Value Date   GLUCAP 255 (H) 06/12/2019   HGBA1C 9.8 (H) 06/10/2019    Review of Glycemic Control  Diabetes history: None- New-onset Outpatient Diabetes medications: None Current orders for Inpatient glycemic control: None  HgbA1C - 9.8% Pt has hx "hyperglycemia" since 2014. In surgery at present.   Inpatient Diabetes Program Recommendations:    Add Novolog 0-15 units Q4H Add Levemir 8 units bid  Appears to be new diagnosis of DM with HgbA1C of 9.8%  Ordered Living Well diabetes book. OP Diabetes Education consult would be beneficial. Will speak to pt later this afternoon.  Continue to follow.  Thank you. Ailene Ards, RD, LDN, CDE Inpatient Diabetes Coordinator [phone]

## 2019-06-12 NOTE — Op Note (Signed)
OPERATIVE NOTE  PROCEDURE:   1.  right carotid endarterectomy with bovine patch angioplasty 2.  right intraoperative carotid ultrasound  PRE-OPERATIVE DIAGNOSIS: right high grade asymptomatic carotid stenosis   POST-OPERATIVE DIAGNOSIS: same as above   SURGEON: Marty Heck, MD  ASSISTANT(S): Risa Grill, PA  ANESTHESIA: general  ESTIMATED BLOOD LOSS: <75 cc  FINDING(S): 1. High grade ulcerated carotid plaque 2.  Continuous Doppler audible flow signatures are appropriate for each carotid artery after endarterectomy and patch. 3.  No evidence of intimal flap visualized on transverse or longitudinal ultrasonography.   SPECIMEN(S):  Carotid plaque (sent to Pathology)  INDICATIONS:   Roy Douglas is a 59 y.o. male who presents with right asymptomatic high grade carotid stenosis.  I discussed with the patient the risks, benefits, and alternatives to carotid endarterectomy.  The patient is aware that the risks of carotid endarterectomy include but are not limited to: bleeding, infection, stroke, myocardial infarction, death, cranial nerve injuries both temporary and permanent, neck hematoma, possible airway compromise, labile blood pressure post-operatively, cerebral hyperperfusion syndrome, and possible need for additional interventions in the future. The patient is aware of the risks and agrees to proceed forward with the procedure.  DESCRIPTION: After full informed written consent was obtained from the patient, the patient was brought back to the operating room and placed supine upon the operating table.  Prior to induction, the patient received IV antibiotics.  After obtaining adequate anesthesia, the patient was placed into semi-Fowler position with a shoulder roll in place and the patient's neck slightly hyperextended and rotated away from the surgical site.  The patient was prepped in the standard fashion for a right carotid endarterectomy.  I made an incision anterior  to the sternocleidomastoid muscle and dissected down through the subcutaneous tissue.  The platysma was opened with electrocautery.  I then used Bovie cautery and blunt dissection to dissect through the underlying platysma and to mobilize the anterior border of the sternocleidomastoid as well as the internal jugular vein laterally.  The facial vein was ligated with 3-0 silk and surgical clips and divided.  After identifying the carotid artery I used Metzenbaum scissors to bluntly dissect the common carotid artery and then controlled this with a umbilical tape.  At this point in time the patient was given 100 units/kg of IV heparin and we checked an ACT to ensure it was greater than 250.  I then carried my dissection cephalad and mobilized the external carotid artery and superior thyroid artery and controlled each of these with a vessel loop.  I then dissected out the internal carotid artery well past the distal plaque.  The internal carotid artery was then controlled with a umbilical tape as well. I was careful to identify the vagus nerve between the internal jugular and common carotid and this was presereved.  I was also careful to identify and preserve the hypoglossal nerve and this was preserved.    Once our ACT was confirmed, I proceeded by clamping the internal carotid artery with a curved bulldog clamp first.  The proximal common carotid artery was controlled with a angled debakey clamp.  The external carotid was controlled with a vessel loop.  I subsequently opened the common carotid artery with an 11 blade scalpel in longitudinal fashion and extended the arteriotomy with Potts scissors onto the ICA past the distal plaque.   I then used a Garment/textile technologist and performed a endarterectomy starting in the common carotid artery.  The external carotid artery was  endarterectomized with an eversion technique and I was careful to feather the distal ICA plaque.  The specimen was passed off the field.  At this point a  10 [place] shunt was brought to the field and then initially placed distally into the ICA after removing the Satinsky clamp and controlled with a umbilical tape. The shunt was back bleed with good flow.  I then placed the proximal end of the shunt in the common carotid artery.  Unfortunately I met resistance advancing the shunt proximally and elected to stop to not force a shunt injury.  There was excellent back bleeding from the ICA.  The endarterectomy site was then flushed with heparinized saline and I was careful to ensure there were no flaps in the endarterectomy site.  I then brought a bovine carotid patch on the field and this was sewn in place with a running anastomosis using a 6-0 Prolene distally and a 5-0 proximal.  The bovine patch was trimmed accordingly.  The artery was flushed antegrade and retrograde prior to completion of the patch.  Once the patch was complete, I flushed up the external carotid artery first prior to releasing the internal carotid artery clamp.  An intraoperative duplex was performed that showed no evidence of any flaps.  I used a doppler and there were good doppler signals in the ICA and ECA.  Once I was happy with the intraoperative ultrasound and doppler the patient was given protamine for reversal.  I used Surgicel Snow to get hemostasis around the patch.  Ultimately the platysma was closed in running fashion with 3-0 Vicryl.  The skin was closed with a running 4-0 Monocryl.  Dermabond was applied with a dry sterile dressing.  The patient was awakened from anesthesia with no new neurological deficit and taken to PACU in stable condition.    COMPLICATIONS: None  CONDITION: Stable  Marty Heck, MD [facility]: [phone]  06/12/2019, 9:43 AM

## 2019-06-12 NOTE — Progress Notes (Signed)
   Called to address A1C 9.8 with new on set DM.  Patient currently on SSI for glucose elevation.  I have placed an order for Diabetic coordinator to consult on discharge oral medication.    Mosetta Pigeon PA-C

## 2019-06-12 NOTE — Progress Notes (Signed)
  Progress Note    06/12/2019 11:00 AM Day of Surgery  Subjective:  Awake and alert in PACU. Reports recent left shoulder injury and that is his only pain currently   Vitals:   06/12/19 1024 06/12/19 1039  BP: 100/66 (!) 97/57  Pulse: 61 63  Resp: 12 12  Temp:    SpO2: 96% 95%    Physical Exam: Cardiac:  RRR Lungs:  nonlabored Incisions:  No bleeding or edema Extremities:  5/5 grip strength bilaterally. 5/5 lower extremity plantar flexion strength Neuro: A and O times 4; tongue midline  CBC    Component Value Date/Time   WBC 9.0 06/10/2019 1355   RBC 5.18 06/10/2019 1355   HGB 15.8 06/10/2019 1355   HCT 46.9 06/10/2019 1355   PLT 266 06/10/2019 1355   MCV 90.5 06/10/2019 1355   MCH 30.5 06/10/2019 1355   MCHC 33.7 06/10/2019 1355   RDW 11.9 06/10/2019 1355   LYMPHSABS 1.2 01/15/2015 0851   MONOABS 0.6 01/15/2015 0851   EOSABS 0.1 01/15/2015 0851   BASOSABS 0.0 01/15/2015 0851    BMET    Component Value Date/Time   NA 135 06/10/2019 1355   K 4.3 06/10/2019 1355   CL 102 06/10/2019 1355   CO2 22 06/10/2019 1355   GLUCOSE 368 (H) 06/10/2019 1355   BUN 16 06/10/2019 1355   CREATININE 1.09 06/10/2019 1355   CREATININE 1.15 10/12/2016 0841   CALCIUM 9.0 06/10/2019 1355   GFRNONAA >60 06/10/2019 1355   GFRAA >60 06/10/2019 1355     Intake/Output Summary (Last 24 hours) at 06/12/2019 1100 Last data filed at 06/12/2019 1005 Gross per 24 hour  Intake 1312.17 ml  Output 100 ml  Net 1212.17 ml    HOSPITAL MEDICATIONS Scheduled Meds: . Chlorhexidine Gluconate Cloth  6 each Topical Once   And  . Chlorhexidine Gluconate Cloth  6 each Topical Once   Continuous Infusions: . sodium chloride    . sodium chloride    . lactated ringers Stopped (06/12/19 0952)   PRN Meds:.sodium chloride, HYDROmorphone (DILAUDID) injection  Assessment:  59 y.o. male is s/p:  Right CEA. Hemodynamically stable. Neurologically intact Day of Surgery  Plan: -to Progressive  care unit -DVT prophylaxis:  SCDs   Wendi Maya, PA-C [facility] [phone] 06/12/2019  11:00 AM

## 2019-06-12 NOTE — Progress Notes (Signed)
Inpatient Diabetes Program Recommendations  AACE/ADA: New Consensus Statement on Inpatient Glycemic Control (2015)  Target Ranges:  Prepandial:   less than 140 mg/dL      Peak postprandial:   less than 180 mg/dL (1-2 hours)      Critically ill patients:  140 - 180 mg/dL   Lab Results  Component Value Date   GLUCAP 237 (H) 06/12/2019   HGBA1C 9.8 (H) 06/10/2019    Review of Glycemic Control  Spoke with pt by phone about his glycemic control and new-onset DM. Pt states his PCP told him that his sugars were elevated about a year ago. Pt lost weight, started exercising and eating a heart-healthy, lower CHO diet. He followed up with PCP and pt states his blood sugar and HgbA1C had normalized. Over the past 6-7 months,he got off track with diet and gained back his weight. Stated, "I know I can do this because I've done it before." Discussed HgbA1C of 9.8% (average blood sugar of 235 mg/dL) and goal of 7%. Discussed home care, monitoring blood sugars, and maintaining good control to reduce risks of complications from diabetes. Discussed impact of nutrition, exercise, stress, sickness, and medications on diabetes control. Ordered Living Well with diabetes booklet and encouraged patient to read through entire book. Pt states he will f/u with PCP within the next week and take blood sugar log to appt. Answered all questions.  Recommendations for discharge:  Metformin 500 mg bid (Breakfast and Dinner) Wilder Glade 10 mg QD Blood glucose meter kit -  #[id number]  Pt appreciated phone call and seems motivated to get his blood sugars in better control.  Thank you. Lorenda Peck, RD, LDN, CDE Inpatient Diabetes Coordinator [phone]

## 2019-06-13 ENCOUNTER — Encounter: Payer: Self-pay | Admitting: *Deleted

## 2019-06-13 LAB — CBC
HCT: 36 % — ABNORMAL LOW (ref 39.0–52.0)
Hemoglobin: 11.9 g/dL — ABNORMAL LOW (ref 13.0–17.0)
MCH: 30.4 pg (ref 26.0–34.0)
MCHC: 33.1 g/dL (ref 30.0–36.0)
MCV: 91.8 fL (ref 80.0–100.0)
Platelets: 206 10*3/uL (ref 150–400)
RBC: 3.92 MIL/uL — ABNORMAL LOW (ref 4.22–5.81)
RDW: 12.6 % (ref 11.5–15.5)
WBC: 9.8 10*3/uL (ref 4.0–10.5)
nRBC: 0 % (ref 0.0–0.2)

## 2019-06-13 LAB — POCT I-STAT, CHEM 8
BUN: 17 mg/dL (ref 6–20)
Calcium, Ion: 1.16 mmol/L (ref 1.15–1.40)
Chloride: 104 mmol/L (ref 98–111)
Creatinine, Ser: 0.7 mg/dL (ref 0.61–1.24)
Glucose, Bld: 266 mg/dL — ABNORMAL HIGH (ref 70–99)
HCT: 41 % (ref 39.0–52.0)
Hemoglobin: 13.9 g/dL (ref 13.0–17.0)
Potassium: 4.2 mmol/L (ref 3.5–5.1)
Sodium: 135 mmol/L (ref 135–145)
TCO2: 25 mmol/L (ref 22–32)

## 2019-06-13 LAB — BASIC METABOLIC PANEL
Anion gap: 7 (ref 5–15)
BUN: 11 mg/dL (ref 6–20)
CO2: 24 mmol/L (ref 22–32)
Calcium: 8.3 mg/dL — ABNORMAL LOW (ref 8.9–10.3)
Chloride: 107 mmol/L (ref 98–111)
Creatinine, Ser: 0.8 mg/dL (ref 0.61–1.24)
GFR calc Af Amer: >60 mL/min (ref >60)
GFR calc non Af Amer: >60 mL/min (ref >60)
Glucose, Bld: 212 mg/dL — ABNORMAL HIGH (ref 70–99)
Potassium: 3.9 mmol/L (ref 3.5–5.1)
Sodium: 138 mmol/L (ref 135–145)

## 2019-06-13 LAB — GLUCOSE, CAPILLARY: Glucose-Capillary: 209 mg/dL — ABNORMAL HIGH (ref 70–99)

## 2019-06-13 LAB — POCT ACTIVATED CLOTTING TIME: Activated Clotting Time: 230 seconds

## 2019-06-13 MED ORDER — METFORMIN HCL 500 MG PO TABS: 500.0000 mg | ORAL_TABLET | Freq: Two times a day (BID) | ORAL | 1 refills | Status: AC

## 2019-06-13 MED ORDER — OXYCODONE-ACETAMINOPHEN 5-325 MG PO TABS: 1.0000 | ORAL_TABLET | Freq: Four times a day (QID) | ORAL | 0 refills | Status: DC | PRN

## 2019-06-13 MED ORDER — FARXIGA 10 MG PO TABS: 10.0000 mg | ORAL_TABLET | Freq: Every day | ORAL | 1 refills | Status: AC

## 2019-06-13 NOTE — Discharge Summary (Signed)
[facility] Discharge Summary   Patient ID:  Roy Douglas MRN: [id number] DOB/AGE: 1960/11/16 59 y.o.  Admit date: 06/12/2019 Discharge date: 06/13/2019 Date of Surgery: 06/12/2019 Surgeon: Surgeon(s): Marty Heck, MD  Admission Diagnosis: Carotid artery stenosis [I65.29] Carotid stenosis, asymptomatic, right [I65.21]  Discharge Diagnoses:  Carotid artery stenosis [I65.29] Carotid stenosis, asymptomatic, right [I65.21]  Secondary Diagnoses: Past Medical History:  Diagnosis Date  . CAD (coronary artery disease)   . Carotid artery disease ([place])   . GERD (gastroesophageal reflux disease)   . Gout   . History of kidney stones    11/2015 per pt but it passed 06/10/19  . Hyperlipidemia   . Subsequent ST elevation (STEMI) myocardial infarction of inferior wall ([place])    stent RCA DES 08/28/12    Procedure(s): RIGHT ENDARTERECTOMY CAROTID  Discharged Condition: good  HPI: 59 y/o male followed for ICA stenosis and surveillance.  A CTA was ordered  that confirmed the right ICA had a higher grade stenosis greater than 80%.  He is asymptomatic and denise amaurosis, weakness and dysphasia.     Hospital Course:  Roy Douglas is a 59 y.o. male is S/P  Procedure(s): RIGHT ENDARTERECTOMY CAROTID He has no neurologic deficits post right CEA.  Moving all extremities.  He will be discharged in stable condition today.  F/U in 2-3 weeks with Dr. Carlis Abbott.     Significant Diagnostic Studies: CBC Lab Results  Component Value Date   WBC 9.8 06/13/2019   HGB 11.9 (L) 06/13/2019   HCT 36.0 (L) 06/13/2019   MCV 91.8 06/13/2019   PLT 206 06/13/2019    BMET    Component Value Date/Time   NA 138 06/13/2019 0350   K 3.9 06/13/2019 0350   CL 107 06/13/2019 0350   CO2 24 06/13/2019 0350   GLUCOSE 212 (H) 06/13/2019 0350   BUN 11 06/13/2019 0350   CREATININE 0.80 06/13/2019 0350   CREATININE 1.15 10/12/2016 0841   CALCIUM 8.3 (L) 06/13/2019 0350   GFRNONAA >60  06/13/2019 0350   GFRAA >60 06/13/2019 0350   COAG Lab Results  Component Value Date   INR 1.0 06/10/2019   INR 0.90 08/28/2012     Disposition:  Discharge to :Home Discharge Instructions    Call MD for:  redness, tenderness, or signs of infection (pain, swelling, bleeding, redness, odor or green/yellow discharge around incision site)   Complete by: As directed    Call MD for:  severe or increased pain, loss or decreased feeling  in affected limb(s)   Complete by: As directed    Call MD for:  temperature >100.5   Complete by: As directed    Resume previous diet   Complete by: As directed      Allergies as of 06/13/2019      Reactions   Keflex [cephalexin]    Mental changes   Shellfish Allergy Nausea And Vomiting   Pt violently vomiting      Medication List    TAKE these medications   atorvastatin 80 MG tablet Commonly known as: LIPITOR TAKE 1 TABLET (80 MG TOTAL) BY MOUTH DAILY AT 6 PM. NEED OV. What changed: additional instructions   clopidogrel 75 MG tablet Commonly known as: PLAVIX TAKE 1 TABLET (75 MG TOTAL) BY MOUTH DAILY. NEED OV. What changed: additional instructions Notes to patient: Take tomorrow AM 06/14/2019   Farxiga 10 MG Tabs tablet Generic drug: dapagliflozin propanediol Take 10 mg by mouth daily before breakfast. Notes to patient: Take tomorrow  AM before breakfast 06/14/2019   Fish Oil Concentrate 1000 MG Caps Take 1,000 mg by mouth daily. Notes to patient: Take tomorrow AM 06/14/2019   metFORMIN 500 MG tablet Commonly known as: Glucophage Take 1 tablet (500 mg total) by mouth 2 (two) times daily with a meal.   multivitamin with minerals tablet Take 1 tablet by mouth daily. Notes to patient: Take tomorrow AM 06/14/2019   nitroGLYCERIN 0.4 MG SL tablet Commonly known as: Nitrostat Place 1 tablet (0.4 mg total) under the tongue every 5 (five) minutes as needed for chest pain. Notes to patient: Take as needed for chest pain 06/13/2019    oxyCODONE-acetaminophen 5-325 MG tablet Commonly known as: PERCOCET/ROXICET Take 1 tablet by mouth every 6 (six) hours as needed for moderate pain. Notes to patient: Take as needed for pain 06/13/2019   VITAMIN B 12 PO Take 1 tablet by mouth daily. Notes to patient: Take tomorrow AM 06/14/2019      Verbal and written Discharge instructions given to the patient. Wound care per Discharge AVS Follow-up Information    Cephus Shelling, MD Follow up in 2 week(s).   Specialty: Vascular Surgery Why: Office will call to arrange follow-up appointment in 2-3 weeks (sent) Contact information: [address] [place] [address] [phone]           Signed: Mosetta Pigeon 06/13/2019, 12:57 PM --- For VQI Registry use --- Instructions: Press F2 to tab through selections.  Delete question if not applicable.   Modified Rankin score at D/C (0-6): Rankin Score=0  IV medication needed for:  1. Hypertension: No 2. Hypotension: No  Post-op Complications: No  1. Post-op CVA or TIA: No  If yes: Event classification (right eye, left eye, right cortical, left cortical, verterobasilar, other):   If yes: Timing of event (intra-op, <6 hrs post-op, >=6 hrs post-op, unknown):   2. CN injury: No  If yes: CN  injuried   3. Myocardial infarction: No  If yes: Dx by (EKG or clinical, Troponin):   4.  CHF: No  5.  Dysrhythmia (new): No  6. Wound infection: No  7. Reperfusion symptoms: No  8. Return to OR: No  If yes: return to OR for (bleeding, neurologic, other CEA incision, other):   Discharge medications: Statin use:  Yes ASA use:  No  for medical reason   Beta blocker use:  No  for medical reason   ACE-Inhibitor use:  No  for medical reason   P2Y12 Antagonist use: [ ]  None, [x ] Plavix, [ ]  Plasugrel, [ ]  Ticlopinine, [ ]  Ticagrelor, [ ]  Other, [ ]  No for medical reason, [ ]  Non-compliant, [ ]  Not-indicated Anti-coagulant use:  [x ] None, [ ]  Warfarin, [ ]   Rivaroxaban, [ ]  Dabigatran, [ ]  Other, [ ]  No for medical reason, [ ]  Non-compliant, [ ]  Not-indicated

## 2019-06-13 NOTE — Progress Notes (Signed)
[facility] [place]  Subjective  - Doing well without deficits.   Objective 105/65 62 98.5 F (36.9 C) (Oral) 14 96%  Intake/Output Summary (Last 24 hours) at 06/13/2019 0715 Last data filed at 06/13/2019 9470 Gross per 24 hour  Intake 2812.17 ml  Output 1950 ml  Net 862.17 ml    Right neck incision healing well without hematoma B UE/LE 5/5 motor Smile is symmetric and no tongue deviation noted. Lungs non labored breathing  Assessment/Planning: POD # 1 right CEA  Stable disposition for discharge home F/U with Dr. Chestine Spore in 2-3 weeks No neurologic deficits.  Roy Douglas 06/13/2019 7:15 AM --  Laboratory Lab Results: Recent Labs    06/10/19 1355 06/10/19 1355 06/12/19 0855 06/13/19 0350  WBC 9.0  --   --  9.8  HGB 15.8   < > 13.9 11.9*  HCT 46.9   < > 41.0 36.0*  PLT 266  --   --  206   < > = values in this interval not displayed.   BMET Recent Labs    06/10/19 1355 06/10/19 1355 06/12/19 0855 06/13/19 0350  NA 135   < > 135 138  K 4.3   < > 4.2 3.9  CL 102   < > 104 107  CO2 22  --   --  24  GLUCOSE 368*   < > 266* 212*  BUN 16   < > 17 11  CREATININE 1.09   < > 0.70 0.80  CALCIUM 9.0  --   --  8.3*   < > = values in this interval not displayed.    COAG Lab Results  Component Value Date   INR 1.0 06/10/2019   INR 0.90 08/28/2012   No results found for: PTT

## 2019-06-13 NOTE — Progress Notes (Signed)
Care plan reviewed, Pt is progressing. Alert and oriented x 4, no neuro sign deficits. His vital signs stable, remains afebrile, sinus rhythm/ sinus brady cardia on monitor,   HR 50s-60s,  BP 102/68 - 112/ 70 mmHg,  SPO2 100% on room air. RR 14- 20. Temp 98.5- 99.2 F  Right carotid surgical wound is dry , intact and clean, negative for bleeding or hematoma.   A- line removed without complications. Pain tolerated well. Percocet given at bed time. He's able to sleep well.  No immediate distress noted to night. Will continue to monitor.  Filiberto Pinks, RN

## 2019-06-13 NOTE — Progress Notes (Signed)
06/13/2019 10:38 AM Discharge AVS meds taken today and those due this evening reviewed.  Follow-up appointments and when to call md reviewed.  D/C IV and TELE.  Questions and concerns addressed.   D/C home per orders. Kathryne Hitch

## 2019-07-01 ENCOUNTER — Ambulatory Visit (INDEPENDENT_AMBULATORY_CARE_PROVIDER_SITE_OTHER): Payer: Self-pay | Admitting: Vascular Surgery

## 2019-07-01 ENCOUNTER — Encounter: Payer: Self-pay | Admitting: Vascular Surgery

## 2019-07-01 ENCOUNTER — Other Ambulatory Visit: Payer: Self-pay

## 2019-07-01 VITALS — BP 129/78 | HR 91 | Temp 97.7°F | Resp 185 | Ht 69.0 in | Wt 216.0 lb

## 2019-07-01 DIAGNOSIS — I6523 Occlusion and stenosis of bilateral carotid arteries: Secondary | ICD-10-CM

## 2019-07-01 NOTE — Progress Notes (Signed)
Patient name: Roy Douglas MRN: [id number] DOB: 1960-12-24 Sex: male  REASON FOR VISIT: Postop check status post right carotid endarterectomy  HPI: Roy Douglas is a 59 y.o. male with history of coronary artery disease status post drug-eluting stent that presents for follow-up after right carotid endarterectomy on 06/12/2019 for high-grade asymptomatic stenosis.  Patient states he has recovered from surgery without any issues.  His neck is healing without any concerns.  No new neurologic deficits or other signs or symptoms of stroke or TIA since discharge.  He remains on Plavix for his history of coronary stents.  Back to work and seems pleased with his progress.  Past Medical History:  Diagnosis Date  . CAD (coronary artery disease)   . Carotid artery disease ([place])   . GERD (gastroesophageal reflux disease)   . Gout   . History of kidney stones    11/2015 per pt but it passed 06/10/19  . Hyperlipidemia   . Subsequent ST elevation (STEMI) myocardial infarction of inferior wall ([place])    stent RCA DES 08/28/12    Past Surgical History:  Procedure Laterality Date  . ANKLE SURGERY    . ENDARTERECTOMY Right 06/12/2019   Procedure: RIGHT ENDARTERECTOMY CAROTID;  Surgeon: Marty Heck, MD;  Location: [facility];  Service: Vascular;  Laterality: Right;  . KNEE SURGERY    . LEFT HEART CATHETERIZATION WITH CORONARY ANGIOGRAM N/A 08/28/2012   Procedure: LEFT HEART CATHETERIZATION WITH CORONARY ANGIOGRAM;  Surgeon: Peter M Martinique, MD;  Location: [facility] CATH LAB;  Service: Cardiovascular;  Laterality: N/A;    Family History  Problem Relation Age of Onset  . Peripheral vascular disease Mother   . Hyperlipidemia Sister   . Hyperlipidemia Brother     SOCIAL HISTORY: Social History   Tobacco Use  . Smoking status: Former Smoker    Types: Cigarettes    Quit date: 07/30/2011    Years since quitting: 7.9  . Smokeless tobacco: Never Used  Substance Use Topics  . Alcohol use: No    Allergies    Allergen Reactions  . Keflex [Cephalexin]     Mental changes  . Shellfish Allergy Nausea And Vomiting    Pt violently vomiting    Current Outpatient Medications  Medication Sig Dispense Refill  . atorvastatin (LIPITOR) 80 MG tablet TAKE 1 TABLET (80 MG TOTAL) BY MOUTH DAILY AT 6 PM. NEED OV. (Patient taking differently: Take 80 mg by mouth daily at 6 PM. ) 30 tablet 11  . clopidogrel (PLAVIX) 75 MG tablet TAKE 1 TABLET (75 MG TOTAL) BY MOUTH DAILY. NEED OV. (Patient taking differently: Take 75 mg by mouth daily. ) 30 tablet 11  . Cyanocobalamin (VITAMIN B 12 PO) Take 1 tablet by mouth daily.     . dapagliflozin propanediol (FARXIGA) 10 MG TABS tablet Take 10 mg by mouth daily before breakfast. 30 tablet 1  . metFORMIN (GLUCOPHAGE) 500 MG tablet Take 1 tablet (500 mg total) by mouth 2 (two) times daily with a meal. 60 tablet 1  . Multiple Vitamins-Minerals (MULTIVITAMIN WITH MINERALS) tablet Take 1 tablet by mouth daily.    . nitroGLYCERIN (NITROSTAT) 0.4 MG SL tablet Place 1 tablet (0.4 mg total) under the tongue every 5 (five) minutes as needed for chest pain. 25 tablet 3  . Omega-3 Fatty Acids (FISH OIL CONCENTRATE) 1000 MG CAPS Take 1,000 mg by mouth daily.     Marland Kitchen oxyCODONE-acetaminophen (PERCOCET/ROXICET) 5-325 MG tablet Take 1 tablet by mouth every 6 (six) hours as needed  for moderate pain. 12 tablet 0   No current facility-administered medications for this visit.    REVIEW OF SYSTEMS:  [X]  denotes positive finding, [ ]  denotes negative finding Cardiac  Comments:  Chest pain or chest pressure:    Shortness of breath upon exertion:    Short of breath when lying flat:    Irregular heart rhythm:        Vascular    Pain in calf, thigh, or hip brought on by ambulation:    Pain in feet at night that wakes you up from your sleep:     Blood clot in your veins:    Leg swelling:         Pulmonary    Oxygen at home:    Productive cough:     Wheezing:         Neurologic    Sudden  weakness in arms or legs:     Sudden numbness in arms or legs:     Sudden onset of difficulty speaking or slurred speech:    Temporary loss of vision in one eye:     Problems with dizziness:         Gastrointestinal    Blood in stool:     Vomited blood:         Genitourinary    Burning when urinating:     Blood in urine:        Psychiatric    Major depression:         Hematologic    Bleeding problems:    Problems with blood clotting too easily:        Skin    Rashes or ulcers:        Constitutional    Fever or chills:      PHYSICAL EXAM: Vitals:   07/01/19 1116 07/01/19 1121  BP: 120/82 129/78  Pulse: 91 91  Resp: (!) 185   Temp: 97.7 F (36.5 C)   TempSrc: Temporal   Weight: 216 lb (98 kg)   Height: 5\' 9"  (1.753 m)     GENERAL: The patient is a well-nourished male, in no acute distress. The vital signs are documented above. CARDIAC: There is a regular rate and rhythm.  VASCULAR:  Right neck incision well healed, no drainage, no hematoma NEUROLOGIC: No focal weakness or paresthesias are detected.  CN II-XII grossly intact.  DATA:   None  Assessment/Plan:  59 year old male status post right carotid endarterectomy for high-grade asymptomatic stenosis on 06/12/2019.  Today his neck looks great and he is neurologically intact.  He has recovered well from surgery.  Discussed plan to follow-up again in 9 months with carotid duplex for ongoing surveillance.  He does have a 40 to 59% stenosis in the left ICA that we will need to monitor as well.  He will remain on Plavix.   , MD [facility] [place] Office: [phone]

## 2019-07-02 ENCOUNTER — Other Ambulatory Visit: Payer: Self-pay | Admitting: *Deleted

## 2019-07-02 DIAGNOSIS — I6523 Occlusion and stenosis of bilateral carotid arteries: Secondary | ICD-10-CM

## 2019-12-07 ENCOUNTER — Other Ambulatory Visit: Payer: Self-pay | Admitting: Cardiology

## 2020-01-12 ENCOUNTER — Telehealth: Payer: Self-pay | Admitting: Cardiology

## 2020-01-12 NOTE — Telephone Encounter (Signed)
Spoke to patient Dr.Jordan's recommendation given. 

## 2020-01-12 NOTE — Telephone Encounter (Signed)
I definitely recommend getting the vaccine.   Zoelle Markus [place] MD, [facility]

## 2020-01-12 NOTE — Telephone Encounter (Signed)
Returned the call to the patient. He stated that he was concerned about the side effects especially with is carotid history. He would rather have Dr. Elvis Coil opinion on whether or not he should have the COVID vaccine.

## 2020-01-12 NOTE — Telephone Encounter (Signed)
We are recommending the COVID-19 vaccine to all of our patients. Cardiac medications (including blood thinners) should not deter anyone from being vaccinated and there is no need to hold any of those medications prior to vaccine administration.     Currently, there is a hotline to call (active 05/23/19) to schedule vaccination appointments as no walk-ins will be accepted.   Number: [phone].    If an appointment is not available please go to [email or web address] to sign up for notification when additional vaccine appointments are available.   If you have further questions or concerns about the vaccine process, please visit [email or web address] or contact your primary care physician.   Patient would like for a nurse to call him. He still have some concerns.

## 2020-06-14 NOTE — Progress Notes (Unsigned)
Virtual Visit via Video Note   This visit type was conducted due to national recommendations for restrictions regarding the COVID-19 Pandemic (e.g. social distancing) in an effort to limit this patient's exposure and mitigate transmission in our community.  Due to his co-morbid illnesses, this patient is at least at moderate risk for complications without adequate follow up.  This format is felt to be most appropriate for this patient at this time.  All issues noted in this document were discussed and addressed.  A limited physical exam was performed with this format.  Please refer to the patient's chart for his consent to telehealth for [facility].       Date:  06/17/2020   ID:  Roy Douglas, DOB 26-Mar-1961, MRN [id number] The patient was identified using 2 identifiers.  Patient Location: Home Provider Location: Office/Clinic  PCP:  Fatima Sanger, MD  Cardiologist:  Nevae Pinnix [place], MD  Electrophysiologist:  None   Evaluation Performed:  Follow-Up Visit  Chief Complaint:  CAD  History of Present Illness:    Roy Douglas is a 60 y.o. male with a hx of CAD, GERD, hyperlipidemia and carotid artery disease.  She had inferior STEMI in April 2014 and underwent emergent stenting of RCA with 2 drug-eluting stents.  Although initial ejection fraction was 45 to 50%.  Subsequent echocardiogram demonstrated EF was 50 to 55%.  Follow-up EGD in September 2016 was normal.  Patient had carotid Doppler in September 2016 that demonstrated moderate left ICA stenosis.   He has managed to lose 20 pounds at that time.  Repeat carotid Doppler obtained on 11/11/2018 showed moderate bilateral ICA stenosis unchanged from the previous year.  1 year repeat Doppler was recommended.  He was seen by Dr. Chestine Spore in August who felt Doppler may have underestimated the true carotid stenosis, therefore CT angiogram of the neck was obtained.  The study obtained on 12/24/2018 demonstrated bulk soft plaque at both carotid  bifurcation resulting in hemodynamically significant stenosis, with high-grade stenosis in right ICA origin approaching radiographic string sign numerically estimated at 75%, left CCA proximal to the bifurcation has a 65% stenosis. He underwent right carotid enterectomy on 06/12/2019.  He was diagnosed with diabetes last year. Now on metformin and Farxiga. Is scheduled for complete lab work at the beginning of March. Has carotid dopplers scheduled for later this month at [facility]. Reports he is exercising regularly and weight is stable. No chest pain or dyspnea. No palpitations or edema. BP has been controlled. He did have shingles this fall.   The patient does not have symptoms concerning for COVID-19 infection (fever, chills, cough, or new shortness of breath).    Past Medical History:  Diagnosis Date  . CAD (coronary artery disease)   . Carotid artery disease (HCC)   . GERD (gastroesophageal reflux disease)   . Gout   . History of kidney stones    11/2015 per pt but it passed 06/10/19  . Hyperlipidemia   . Subsequent ST elevation (STEMI) myocardial infarction of inferior wall (HCC)    stent RCA DES 08/28/12   Past Surgical History:  Procedure Laterality Date  . ANKLE SURGERY    . ENDARTERECTOMY Right 06/12/2019   Procedure: RIGHT ENDARTERECTOMY CAROTID;  Surgeon: Cephus Shelling, MD;  Location: [facility] OR;  Service: Vascular;  Laterality: Right;  . KNEE SURGERY    . LEFT HEART CATHETERIZATION WITH CORONARY ANGIOGRAM N/A 08/28/2012   Procedure: LEFT HEART CATHETERIZATION WITH CORONARY ANGIOGRAM;  Surgeon: Tristan Proto M [place], MD;  Location: [facility] CATH LAB;  Service: Cardiovascular;  Laterality: N/A;     Current Meds  Medication Sig  . atorvastatin (LIPITOR) 80 MG tablet TAKE 1 TABLET (80 MG TOTAL) BY MOUTH DAILY AT 6 PM. **NEED OFFICE VISIT**  . clopidogrel (PLAVIX) 75 MG tablet TAKE 1 TABLET (75 MG TOTAL) BY MOUTH DAILY. **NEED OFFICE VISIT**  . Cyanocobalamin (VITAMIN B 12 PO) Take 1 tablet by  mouth daily.   . dapagliflozin propanediol (FARXIGA) 10 MG TABS tablet Take 10 mg by mouth daily before breakfast.  . metFORMIN (GLUCOPHAGE) 500 MG tablet Take by mouth 2 (two) times daily with a meal.  . Multiple Vitamins-Minerals (MULTIVITAMIN WITH MINERALS) tablet Take 1 tablet by mouth daily.  . nitroGLYCERIN (NITROSTAT) 0.4 MG SL tablet Place 1 tablet (0.4 mg total) under the tongue every 5 (five) minutes as needed for chest pain.  . Omega-3 Fatty Acids (FISH OIL CONCENTRATE) 1000 MG CAPS Take 1,000 mg by mouth daily.      Allergies:   Keflex [cephalexin] and Shellfish allergy   Social History   Tobacco Use  . Smoking status: Former Smoker    Types: Cigarettes    Quit date: 07/30/2011    Years since quitting: 8.8  . Smokeless tobacco: Never Used  Substance Use Topics  . Alcohol use: No  . Drug use: Yes    Frequency: 3.0 times per week    Types: Marijuana     Family Hx: The patient's family history includes Hyperlipidemia in his brother and sister; Peripheral vascular disease in his mother.  ROS:   Please see the history of present illness.    All other systems reviewed and are negative.   Prior CV studies:   The following studies were reviewed today:  none  Labs/Other Tests and Data Reviewed:    EKG:  No ECG reviewed.  Recent Labs: No results found for requested labs within last 8760 hours.   Recent Lipid Panel Lab Results  Component Value Date/Time   CHOL 155 10/12/2016 08:41 AM   TRIG 254 (H) 10/12/2016 08:41 AM   HDL 32 (L) 10/12/2016 08:41 AM   CHOLHDL 4.8 10/12/2016 08:41 AM   LDLCALC 72 10/12/2016 08:41 AM   Dated  06/10/19: A1c 9.8%. glucose 212. Hgb 11.9. otherwise CBC and CMET normal  Wt Readings from Last 3 Encounters:  06/17/20 213 lb (96.6 kg)  07/01/19 216 lb (98 kg)  06/12/19 226 lb 13.7 oz (102.9 kg)     Risk Assessment/Calculations:      Objective:    Vital Signs:  BP 119/89   Pulse 82   Ht 5\' 9"  (1.753 m)   Wt 213 lb (96.6 kg)    BMI 31.45 kg/m    VITAL SIGNS:  reviewed  ASSESSMENT & PLAN:    1. CAD: s/p remote inferior STEMI in 2014. s/p stenting of the RCA. He is asymptomatic. Continue Plavix and statin. Lifestyle modification.  2. Hyperlipidemia: He is on fish oil and Lipitor. Plan follow up lab with primary care in March  3. Bilateral carotid artery disease: Followed by vascular surgery.  s/p  right carotid enterectomy.  4. DM type 2 on metformin and Farxiga.      COVID-19 Education: The signs and symptoms of COVID-19 were discussed with the patient and how to seek care for testing (follow up with PCP or arrange E-visit).  The importance of social distancing was discussed today.  Time:   Today, I have spent 10 minutes with the patient with  telehealth technology discussing the above problems.     Medication Adjustments/Labs and Tests Ordered: Current medicines are reviewed at length with the patient today.  Concerns regarding medicines are outlined above.   Tests Ordered: No orders of the defined types were placed in this encounter.   Medication Changes: No orders of the defined types were placed in this encounter.   Follow Up:  In Person in 1 year(s)  Signed, Kiah Vanalstine [place], MD  06/17/2020 8:23 AM    [facility]

## 2020-06-17 ENCOUNTER — Telehealth: Payer: Self-pay

## 2020-06-17 ENCOUNTER — Encounter: Payer: Self-pay | Admitting: Cardiology

## 2020-06-17 ENCOUNTER — Telehealth (INDEPENDENT_AMBULATORY_CARE_PROVIDER_SITE_OTHER): Payer: No Typology Code available for payment source | Admitting: Cardiology

## 2020-06-17 VITALS — BP 119/89 | HR 82 | Ht 69.0 in | Wt 213.0 lb

## 2020-06-17 DIAGNOSIS — I6523 Occlusion and stenosis of bilateral carotid arteries: Secondary | ICD-10-CM | POA: Diagnosis not present

## 2020-06-17 DIAGNOSIS — I2510 Atherosclerotic heart disease of native coronary artery without angina pectoris: Secondary | ICD-10-CM

## 2020-06-17 DIAGNOSIS — E782 Mixed hyperlipidemia: Secondary | ICD-10-CM

## 2020-06-17 DIAGNOSIS — E119 Type 2 diabetes mellitus without complications: Secondary | ICD-10-CM | POA: Diagnosis not present

## 2020-06-17 MED ORDER — NITROGLYCERIN 0.4 MG SL SUBL: 0.4000 mg | SUBLINGUAL_TABLET | SUBLINGUAL | 3 refills | Status: AC | PRN

## 2020-06-17 NOTE — Patient Instructions (Signed)
Medication Instructions:  Continue same medications *If you need a refill on your cardiac medications before your next appointment, please call your pharmacy*   Lab Work: None ordered   Testing/Procedures: None ordered   Follow-Up: At [facility], you and your health needs are our priority.  As part of our continuing mission to provide you with exceptional heart care, we have created designated Provider Care Teams.  These Care Teams include your primary Cardiologist (physician) and Advanced Practice Providers (APPs -  Physician Assistants and Nurse Practitioners) who all work together to provide you with the care you need, when you need it.  We recommend signing up for the patient portal called "MyChart".  Sign up information is provided on this After Visit Summary.  MyChart is used to connect with patients for Virtual Visits (Telemedicine).  Patients are able to view lab/test results, encounter notes, upcoming appointments, etc.  Non-urgent messages can be sent to your provider as well.   To learn more about what you can do with MyChart, go to [email or web address].    Your next appointment:  1 year   Call in Nov to schedule Feb appointment    The format for your next appointment:  Office   Provider:  Dr.Jordan   

## 2020-06-17 NOTE — Telephone Encounter (Signed)
  Patient Consent for Virtual Visit         Roy Douglas has provided verbal consent on 06/17/2020 for a virtual visit (video or telephone).   CONSENT FOR VIRTUAL VISIT FOR:  Roy Douglas  By participating in this virtual visit I agree to the following:  I hereby voluntarily request, consent and authorize [facility] and its employed or contracted physicians, physician assistants, nurse practitioners or other licensed health care professionals (the Practitioner), to provide me with telemedicine health care services (the "Services") as deemed necessary by the treating Practitioner. I acknowledge and consent to receive the Services by the Practitioner via telemedicine. I understand that the telemedicine visit will involve communicating with the Practitioner through live audiovisual communication technology and the disclosure of certain medical information by electronic transmission. I acknowledge that I have been given the opportunity to request an in-person assessment or other available alternative prior to the telemedicine visit and am voluntarily participating in the telemedicine visit.  I understand that I have the right to withhold or withdraw my consent to the use of telemedicine in the course of my care at any time, without affecting my right to future care or treatment, and that the Practitioner or I may terminate the telemedicine visit at any time. I understand that I have the right to inspect all information obtained and/or recorded in the course of the telemedicine visit and may receive copies of available information for a reasonable fee.  I understand that some of the potential risks of receiving the Services via telemedicine include:  Marland Kitchen Delay or interruption in medical evaluation due to technological equipment failure or disruption; . Information transmitted may not be sufficient (e.g. poor resolution of images) to allow for appropriate medical decision making by the Practitioner;  and/or  . In rare instances, security protocols could fail, causing a breach of personal health information.  Furthermore, I acknowledge that it is my responsibility to provide information about my medical history, conditions and care that is complete and accurate to the best of my ability. I acknowledge that Practitioner's advice, recommendations, and/or decision may be based on factors not within their control, such as incomplete or inaccurate data provided by me or distortions of diagnostic images or specimens that may result from electronic transmissions. I understand that the practice of medicine is not an exact science and that Practitioner makes no warranties or guarantees regarding treatment outcomes. I acknowledge that a copy of this consent can be made available to me via my patient portal [facility] MyChart), or I can request a printed copy by calling the office of [facility].    I understand that my insurance will be billed for this visit.   I have read or had this consent read to me. . I understand the contents of this consent, which adequately explains the benefits and risks of the Services being provided via telemedicine.  . I have been provided ample opportunity to ask questions regarding this consent and the Services and have had my questions answered to my satisfaction. . I give my informed consent for the services to be provided through the use of telemedicine in my medical care

## 2020-06-17 NOTE — Addendum Note (Signed)
Addended by: [place], Tristyn Demarest M on: 06/17/2020 08:49 AM   Modules accepted: Level of Service

## 2020-09-12 ENCOUNTER — Other Ambulatory Visit: Payer: Self-pay | Admitting: Cardiology

## 2020-10-11 IMAGING — CR DG CHEST 2V
2 series · 2 of 2 positions shown · non-contrast
Comparison: None.

CLINICAL DATA: Preoperative evaluation for carotid artery region
surgery

EXAM:
CHEST - 2 VIEW

[w chest pa]
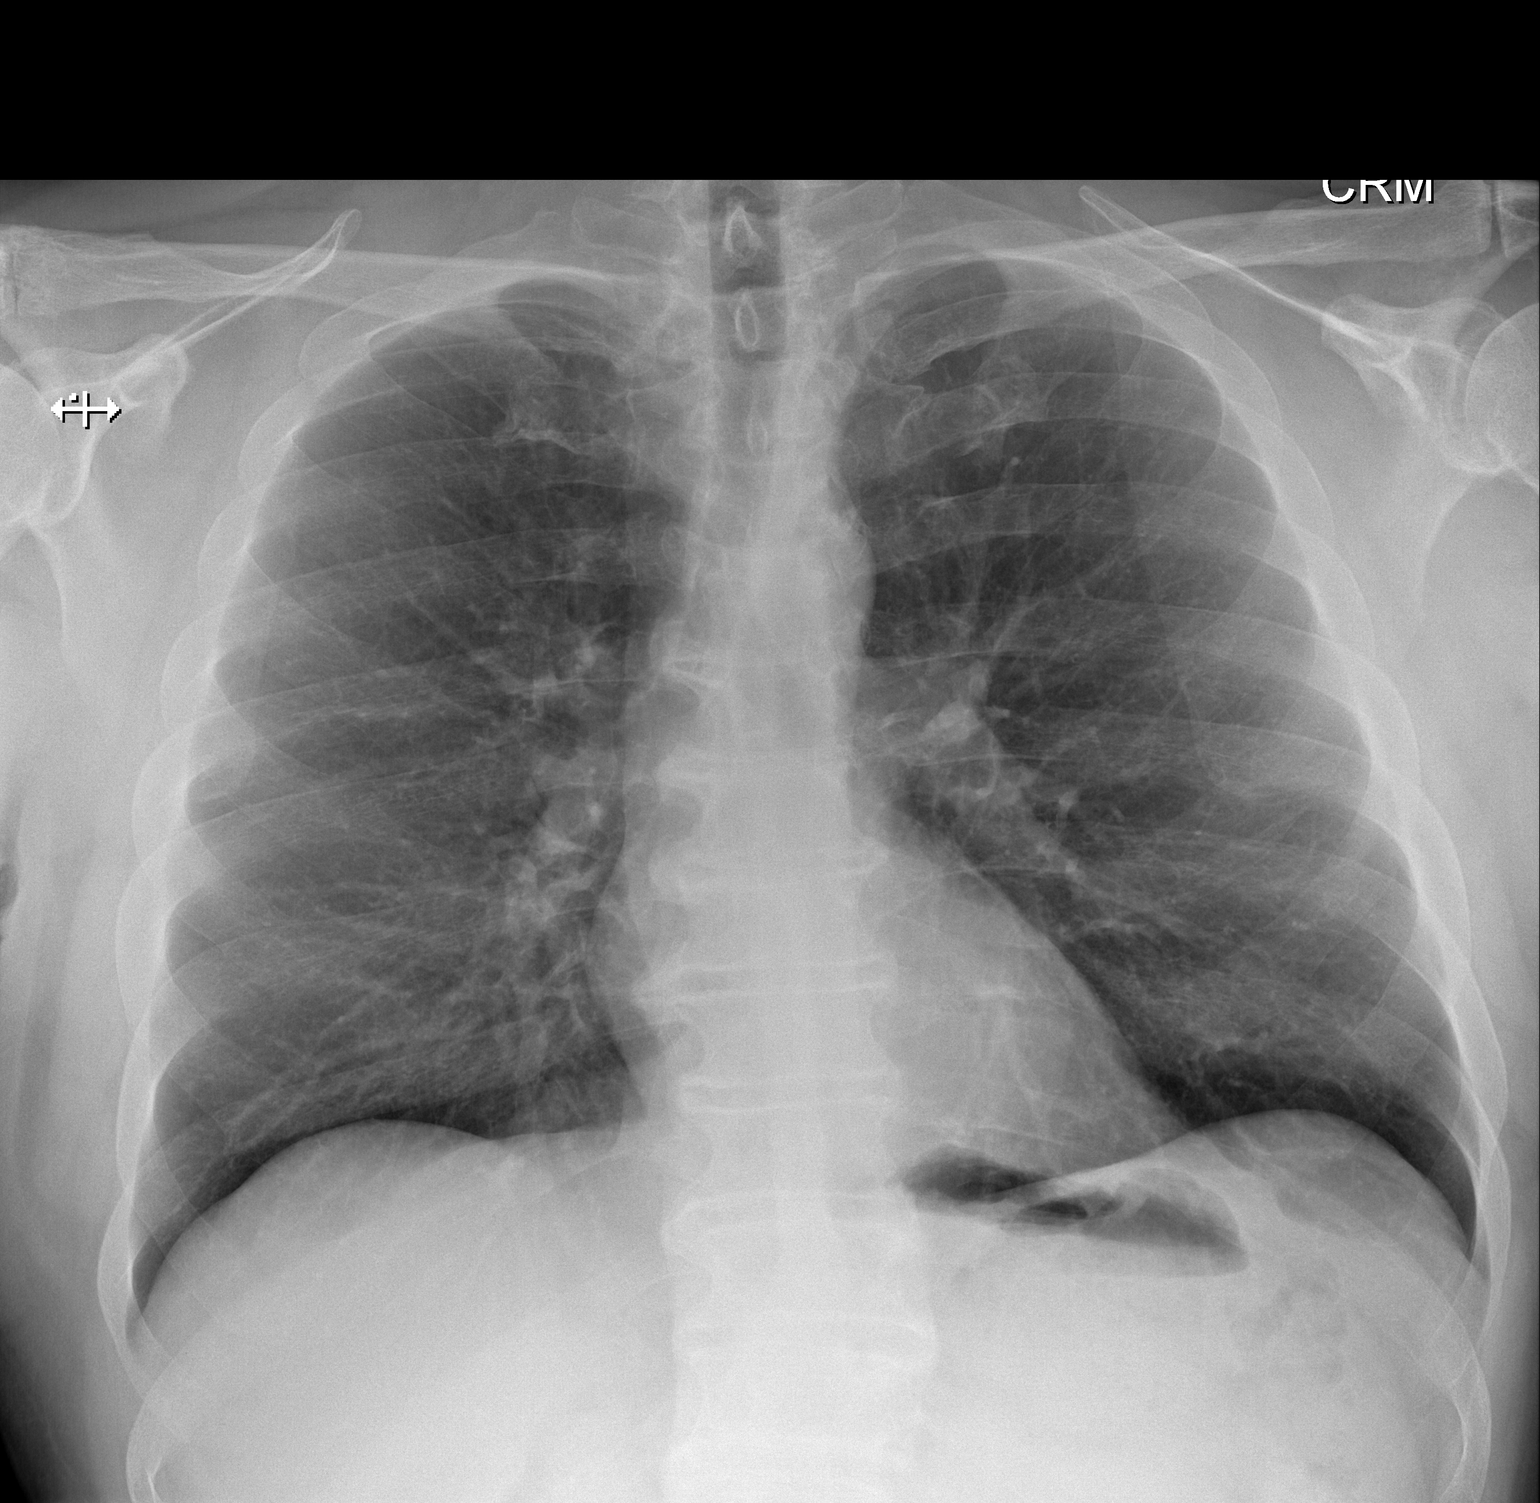

[w chest lat]
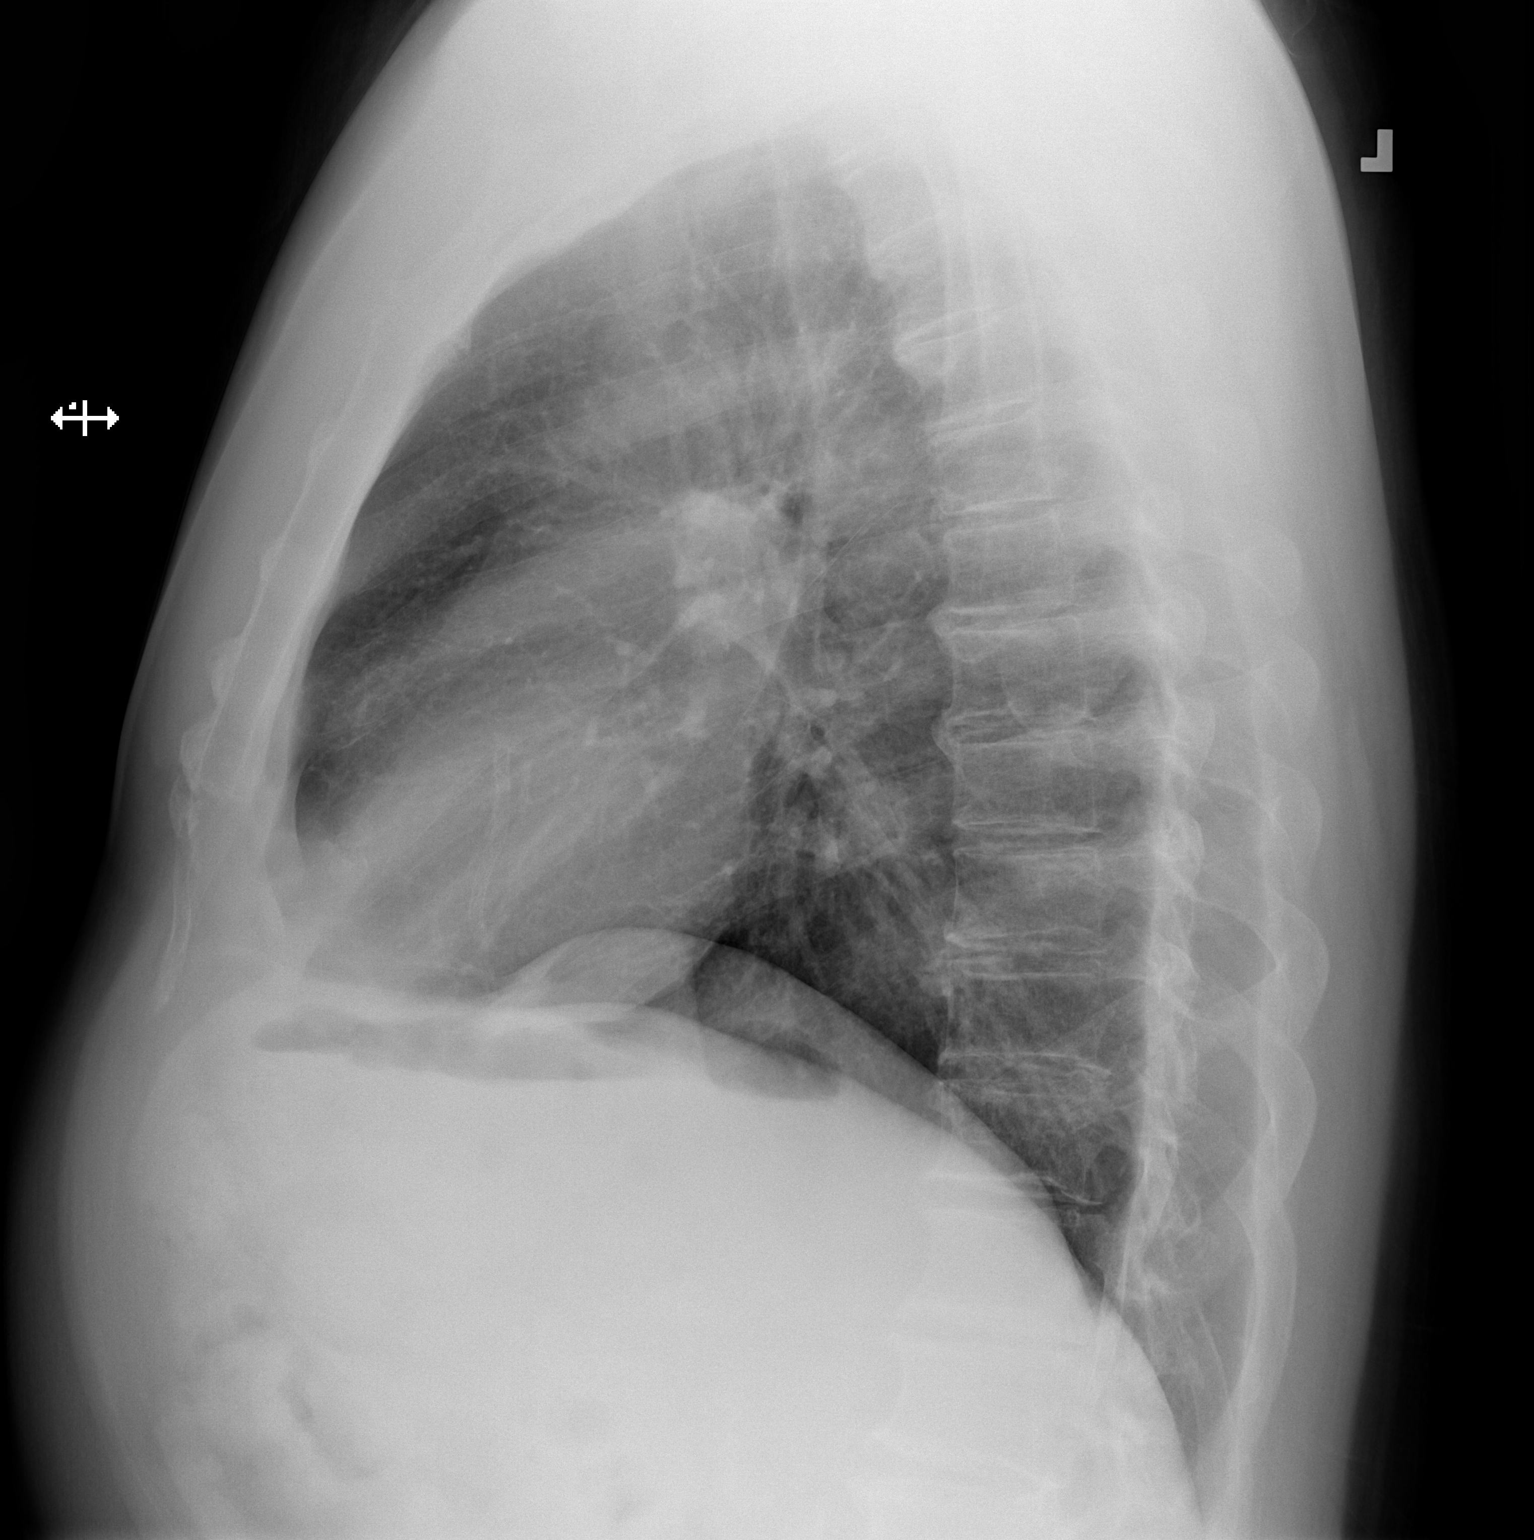

[2 of 2 positions shown; findings below may reference images not displayed]

FINDINGS: Lungs are clear. Heart size and pulmonary vascularity are normal. No
adenopathy. There is degenerative change in the thoracic spine.
IMPRESSION: Lungs clear.  Cardiac silhouette normal.  No adenopathy.

## 2021-09-13 ENCOUNTER — Other Ambulatory Visit: Payer: Self-pay | Admitting: Cardiology

## 2021-10-09 ENCOUNTER — Other Ambulatory Visit: Payer: Self-pay | Admitting: Cardiology

## 2021-10-25 ENCOUNTER — Other Ambulatory Visit: Payer: Self-pay | Admitting: *Deleted

## 2021-10-25 DIAGNOSIS — I6529 Occlusion and stenosis of unspecified carotid artery: Secondary | ICD-10-CM

## 2021-11-01 ENCOUNTER — Ambulatory Visit (INDEPENDENT_AMBULATORY_CARE_PROVIDER_SITE_OTHER): Payer: No Typology Code available for payment source | Admitting: Vascular Surgery

## 2021-11-01 ENCOUNTER — Ambulatory Visit (HOSPITAL_COMMUNITY)
Admission: RE | Admit: 2021-11-01 | Discharge: 2021-11-01 | Disposition: A | Discharge status: Home / Self Care | Payer: No Typology Code available for payment source | Source: Ambulatory Visit | Attending: Vascular Surgery | Admitting: Vascular Surgery

## 2021-11-01 ENCOUNTER — Encounter: Payer: Self-pay | Admitting: Vascular Surgery

## 2021-11-01 VITALS — BP 131/79 | HR 84 | Temp 97.6°F | Resp 18 | Ht 68.0 in | Wt 209.0 lb

## 2021-11-01 DIAGNOSIS — I6523 Occlusion and stenosis of bilateral carotid arteries: Secondary | ICD-10-CM

## 2021-11-01 DIAGNOSIS — I6529 Occlusion and stenosis of unspecified carotid artery: Secondary | ICD-10-CM | POA: Insufficient documentation

## 2021-11-01 NOTE — Progress Notes (Signed)
Patient name: Roy Douglas MRN: [id number] DOB: [birth year] Sex: male  REASON FOR VISIT: Follow-up for surveillance of carotid artery disease  HPI: Roy Douglas is a 61 y.o. male with history of coronary artery disease status post STEMI, hyperlipidemia, carotid artery disease that presents for follow-up and surveillance of his carotid artery disease.  He previous underwent a right carotid endarterectomy on 06/12/2019 by myself for a high-grade asymptomatic stenosis.  At the time of intervention he had a known 40 to 59% left ICA stenosis.  He did well and was discharged from the hospital.  He has not been seen since 2021.  On follow-up today he reports no neurologic issues since last evaluation.  He does have some soreness along the bottom of the sternocleidomastoid when exercising particularly when bench pressing.  He is taking Plavix and statin daily as directed by his cardiologist  Past Medical History:  Diagnosis Date   CAD (coronary artery disease)    Carotid artery disease (HCC)    GERD (gastroesophageal reflux disease)    Gout    History of kidney stones    11/2015 per pt but it passed 06/10/19   Hyperlipidemia    Subsequent ST elevation (STEMI) myocardial infarction of inferior wall (HCC)    stent RCA DES 08/28/12    Past Surgical History:  Procedure Laterality Date   ANKLE SURGERY     ENDARTERECTOMY Right 06/12/2019   Procedure: RIGHT ENDARTERECTOMY CAROTID;  Surgeon: Cephus Shelling, MD;  Location: [facility] OR;  Service: Vascular;  Laterality: Right;   KNEE SURGERY     LEFT HEART CATHETERIZATION WITH CORONARY ANGIOGRAM N/A 08/28/2012   Procedure: LEFT HEART CATHETERIZATION WITH CORONARY ANGIOGRAM;  Surgeon: Peter M [place], MD;  Location: [facility] CATH LAB;  Service: Cardiovascular;  Laterality: N/A;    Family History  Problem Relation Age of Onset   Peripheral vascular disease Mother    Hyperlipidemia Sister    Hyperlipidemia Brother     SOCIAL HISTORY: Social History    Tobacco Use   Smoking status: Former    Types: Cigarettes    Quit date: 07/30/2011    Years since quitting: 10.2   Smokeless tobacco: Never  Substance Use Topics   Alcohol use: No    Allergies  Allergen Reactions   Keflex [Cephalexin]     Mental changes   Shellfish Allergy Nausea And Vomiting    Pt violently vomiting    Current Outpatient Medications  Medication Sig Dispense Refill   atorvastatin (LIPITOR) 80 MG tablet Take 1 tablet (80 mg total) by mouth daily. Make an appointment for cardiologist for further refills 30 tablet 0   clopidogrel (PLAVIX) 75 MG tablet Take 1 tablet (75 mg total) by mouth daily. 90 tablet 3   Cyanocobalamin (VITAMIN B 12 PO) Take 1 tablet by mouth daily.      dapagliflozin propanediol (FARXIGA) 10 MG TABS tablet Take 10 mg by mouth daily before breakfast. 30 tablet 1   metFORMIN (GLUCOPHAGE) 500 MG tablet Take by mouth 2 (two) times daily with a meal.     Multiple Vitamins-Minerals (MULTIVITAMIN WITH MINERALS) tablet Take 1 tablet by mouth daily.     nitroGLYCERIN (NITROSTAT) 0.4 MG SL tablet Place 1 tablet (0.4 mg total) under the tongue every 5 (five) minutes as needed for chest pain. 25 tablet 3   Omega-3 Fatty Acids (FISH OIL CONCENTRATE) 1000 MG CAPS Take 1,000 mg by mouth daily.      TRADJENTA 5 MG TABS tablet Take 5 mg by  mouth daily.     metFORMIN (GLUCOPHAGE) 500 MG tablet Take 1 tablet (500 mg total) by mouth 2 (two) times daily with a meal. 60 tablet 1   No current facility-administered medications for this visit.    REVIEW OF SYSTEMS:  [X]  denotes positive finding, [ ]  denotes negative finding Cardiac  Comments:  Chest pain or chest pressure:    Shortness of breath upon exertion:    Short of breath when lying flat:    Irregular heart rhythm:        Vascular    Pain in calf, thigh, or hip brought on by ambulation:    Pain in feet at night that wakes you up from your sleep:     Blood clot in your veins:    Leg swelling:          Pulmonary    Oxygen at home:    Productive cough:     Wheezing:         Neurologic    Sudden weakness in arms or legs:     Sudden numbness in arms or legs:     Sudden onset of difficulty speaking or slurred speech:    Temporary loss of vision in one eye:     Problems with dizziness:         Gastrointestinal    Blood in stool:     Vomited blood:         Genitourinary    Burning when urinating:     Blood in urine:        Psychiatric    Major depression:         Hematologic    Bleeding problems:    Problems with blood clotting too easily:        Skin    Rashes or ulcers:        Constitutional    Fever or chills:      PHYSICAL EXAM: Vitals:   11/01/21 1550 11/01/21 1557  BP: 122/77 131/79  Pulse: 84 84  Resp: 18   Temp: 97.6 F (36.4 C)   TempSrc: Temporal   SpO2: 97%   Weight: 209 lb (94.8 kg)   Height: 5\' 8"  (1.727 m)     GENERAL: The patient is a well-nourished male, in no acute distress. The vital signs are documented above. CARDIAC: There is a regular rate and rhythm.  VASCULAR:  Right neck incision well-healed No drainage or other fullness PULMONARY: No respiratory distress. ABDOMEN: Soft and non-tender. MUSCULOSKELETAL: There are no major deformities or cyanosis. NEUROLOGIC: No focal weakness or paresthesias are detected. SKIN: There are no ulcers or rashes noted. PSYCHIATRIC: The patient has a normal affect.  DATA:   Carotid duplex today shows widely patent right carotid endarterectomy site.  Stable 40 to 59% stenosis in the left ICA.  Assessment/Plan:  61 year old male that underwent a right carotid endarterectomy on 06/12/2019 for high-grade asymptomatic stenosis.  He presents for interval follow-up and ongoing surveillance.  Discussed that his carotid endarterectomy site on the right is widely patent by ultrasound today with no evidence of recurrent stenosis.  His left ICA has a stable 40 to 59% stenosis consistent with his previous exam in  2021.  Discussed in the setting of asymptomatic carotid disease reserve surgical invention for greater than 80% stenosis.  He is on Plavix statin as directed by his cardiologist for appropriate risk reduction.  We will see him in 1 year with carotid duplex for continued surveillance.   46  Sondra Barges, MD [facility] Office: [phone]

## 2021-11-11 ENCOUNTER — Other Ambulatory Visit: Payer: Self-pay | Admitting: Cardiology
